# Patient Record
Sex: Female | Born: 1935 | Race: White | Marital: Single | Smoking: Never smoker
Health system: Northeastern US, Academic
[De-identification: ages and names within clinical notes are randomized; demographics above are authoritative.]

## PROBLEM LIST (undated history)

## (undated) DIAGNOSIS — E039 Hypothyroidism, unspecified: Secondary | ICD-10-CM

## (undated) DIAGNOSIS — Z9109 Other allergy status, other than to drugs and biological substances: Secondary | ICD-10-CM

## (undated) DIAGNOSIS — I10 Essential (primary) hypertension: Secondary | ICD-10-CM

## (undated) HISTORY — DX: Other allergy status, other than to drugs and biological substances: Z91.09

---

## 2015-09-06 LAB — LIPID PANEL
Chol/HDL Ratio: 2.1
Cholesterol: 109 mg/dL
HDL: 52 mg/dL
LDL Calculated: 45 mg/dL
Non HDL Cholesterol: 57 mg/dL
Triglycerides: 62 mg/dL

## 2016-07-11 LAB — HM MAMMOGRAPHY

## 2016-09-05 LAB — HEMOGLOBIN A1C: Hemoglobin A1C: 6.8 % — ABNORMAL HIGH

## 2016-12-21 DIAGNOSIS — I10 Essential (primary) hypertension: Secondary | ICD-10-CM

## 2016-12-21 DIAGNOSIS — E034 Atrophy of thyroid (acquired): Secondary | ICD-10-CM

## 2016-12-21 DIAGNOSIS — K219 Gastro-esophageal reflux disease without esophagitis: Secondary | ICD-10-CM

## 2016-12-21 DIAGNOSIS — E119 Type 2 diabetes mellitus without complications: Secondary | ICD-10-CM

## 2016-12-21 DIAGNOSIS — E782 Mixed hyperlipidemia: Secondary | ICD-10-CM

## 2016-12-21 DIAGNOSIS — M810 Age-related osteoporosis without current pathological fracture: Secondary | ICD-10-CM

## 2016-12-21 DIAGNOSIS — F331 Major depressive disorder, recurrent, moderate: Secondary | ICD-10-CM

## 2016-12-27 ENCOUNTER — Other Ambulatory Visit: Payer: Self-pay | Admitting: Primary Care

## 2016-12-27 ENCOUNTER — Encounter: Payer: Self-pay | Admitting: Primary Care

## 2016-12-27 MED ORDER — CALCIUM CARBONATE-VITAMIN D 600-400 MG-UNIT PO CHEW
1.0000 | CHEWABLE_TABLET | Freq: Every day | ORAL | 1 refills | Status: DC
Start: 2016-12-27 — End: 2016-12-28

## 2016-12-27 MED ORDER — LOSARTAN POTASSIUM 100 MG PO TABS *I*
100.0000 mg | ORAL_TABLET | Freq: Every day | ORAL | 1 refills | Status: DC
Start: 2016-12-27 — End: 2017-03-26

## 2016-12-27 MED ORDER — ASPIRIN 81 MG PO TBEC *I*
81.0000 mg | DELAYED_RELEASE_TABLET | Freq: Every day | ORAL | 1 refills | Status: DC
Start: 2016-12-27 — End: 2017-09-06

## 2016-12-27 MED ORDER — DULOXETINE HCL 20 MG PO CPEP *I*
20.0000 mg | DELAYED_RELEASE_CAPSULE | Freq: Every day | ORAL | 1 refills | Status: DC
Start: 2016-12-27 — End: 2017-04-02

## 2016-12-27 MED ORDER — METFORMIN HCL 500 MG PO TB24 *I*
500.0000 mg | ORAL_TABLET | Freq: Two times a day (BID) | ORAL | 1 refills | Status: DC
Start: 2016-12-27 — End: 2017-05-14

## 2016-12-27 MED ORDER — VITAMIN D 2000 UNIT PO TABS *I*
2000.0000 [IU] | ORAL_TABLET | Freq: Every day | ORAL | 1 refills | Status: DC
Start: 2016-12-27 — End: 2017-08-07

## 2016-12-27 MED ORDER — LEVOTHYROXINE SODIUM 100 MCG PO TABS *I*
100.0000 ug | ORAL_TABLET | Freq: Every day | ORAL | 1 refills | Status: DC
Start: 2016-12-27 — End: 2017-05-30

## 2016-12-27 MED ORDER — TAB-A-VITE PO TABS *A*
1.0000 | ORAL_TABLET | Freq: Every day | ORAL | 1 refills | Status: DC
Start: 2016-12-27 — End: 2017-08-07

## 2016-12-27 MED ORDER — PANTOPRAZOLE SODIUM 40 MG PO TBEC *I*
40.0000 mg | DELAYED_RELEASE_TABLET | Freq: Every day | ORAL | 1 refills | Status: DC
Start: 2016-12-27 — End: 2017-05-07

## 2016-12-27 MED ORDER — ATORVASTATIN CALCIUM 10 MG PO TABS *I*
10.0000 mg | ORAL_TABLET | Freq: Every day | ORAL | 1 refills | Status: DC
Start: 2016-12-27 — End: 2017-03-18

## 2016-12-28 ENCOUNTER — Telehealth: Payer: Self-pay | Admitting: Primary Care

## 2016-12-28 MED ORDER — CALCIUM CARBONATE-VITAMIN D 600-400 MG-UNIT PO TABS *WRAPPED*
1.0000 | ORAL_TABLET | Freq: Every day | ORAL | 1 refills | Status: DC
Start: 2016-12-28 — End: 2017-08-07

## 2016-12-28 NOTE — Telephone Encounter (Signed)
Can you please change script for this patient?

## 2016-12-28 NOTE — Telephone Encounter (Signed)
[facility] called about the script for calcium/vitamin D chew tabs, these are not covered by her insurance.  She will  Have to purchase these herself. Or Synetta Failnita can change the order to 600 mg or 400mg  regular tabs

## 2016-12-28 NOTE — Telephone Encounter (Signed)
Done

## 2017-01-10 ENCOUNTER — Encounter: Payer: Self-pay | Admitting: Gastroenterology

## 2017-01-15 ENCOUNTER — Encounter: Payer: Self-pay | Admitting: Primary Care

## 2017-02-26 ENCOUNTER — Encounter: Payer: Self-pay | Admitting: Gastroenterology

## 2017-03-16 ENCOUNTER — Other Ambulatory Visit: Payer: Self-pay | Admitting: Endocrinology

## 2017-03-16 LAB — COMPREHENSIVE METABOLIC PANEL
A/G RATIO: 1.4 (ref 1.1–1.8)
ALT: 13 U/L (ref 1–33)
AST: 19 U/L (ref 14–34)
Albumin: 3.9 g/dL (ref 3.5–5.2)
Alk Phos: 84 U/L (ref 53–141)
Bilirubin,Total: 0.6 mg/dL (ref 0.3–1.2)
CO2: 30 mmol/L (ref 20–31)
Calcium: 9.2 mg/dL (ref 8.6–10.2)
Chloride: 102 mmol/L (ref 99–109)
Creatinine: 0.71 mg/dL (ref 0.50–1.10)
Globulin: 2.8 GM/DL (ref 1.6–3.6)
Glucose: 102 MG/DL — ABNORMAL HIGH (ref 60–99)
Lab: 16 mg/dL (ref 9–23)
Potassium: 4.3 mmol/L (ref 3.5–5.1)
Sodium: 138 mmol/L (ref 136–145)
Total Protein: 6.7 g/dL (ref 6.4–8.3)
UN/Creat Ratio: 23 — ABNORMAL HIGH (ref 10–20)

## 2017-03-16 LAB — LIPID PANEL
Chol/HDL Ratio: 2.1
Cholesterol: 115 mg/dL (ref 1–199)
HDL: 56 mg/dL (ref 40–60)
LDL Calculated: 47 mg/dL (ref <100)
Non HDL Cholesterol: 59 mg/dL (ref <130)
Triglycerides: 60 mg/dL (ref <150)

## 2017-03-16 LAB — CBC AND DIFFERENTIAL
Baso # K/uL: 0.1 10*3/uL (ref 0.0–0.2)
Basophil %: 0.7 % (ref 0.0–1.8)
Eos # K/uL: 0.4 10*3/uL (ref 0.0–0.5)
Eosinophil %: 6.3 % (ref 0.0–7.0)
Hematocrit: 32.6 % — ABNORMAL LOW (ref 34.0–47.0)
Hemoglobin: 10.6 g/dL — ABNORMAL LOW (ref 11.5–16.0)
Lymph # K/uL: 2 10*3/uL (ref 0.9–3.8)
Lymphocyte %: 29 % (ref 17.0–44.0)
MCH: 29.1 PG (ref 26.0–34.0)
MCHC: 32.5 g/dL (ref 32.0–36.0)
MCV: 89.6 FL (ref 81.0–99.0)
Mono # K/uL: 0.4 10*3/uL (ref 0.2–1.0)
Monocyte %: 6.3 % (ref 4.0–12.0)
Neut # K/uL: 4 10*3/uL (ref 1.5–7.7)
Platelets: 313 10*3/uL (ref 140–400)
RBC: 3.64 10*6/uL — ABNORMAL LOW (ref 3.80–5.20)
RDW: 12.8 % (ref 11.5–15.0)
Seg Neut %: 57.7 % (ref 40.0–75.0)
WBC: 6.9 10*3/uL (ref 4.0–10.8)

## 2017-03-16 LAB — T4, FREE: Free T4: 1.2 ng/dL (ref 0.9–1.8)

## 2017-03-16 LAB — HEMOGLOBIN A1C
Est Avg Glucose: 134 mg/dL — ABNORMAL HIGH (ref 68–126)
Hemoglobin A1C: 6.3 % — ABNORMAL HIGH (ref 4.0–6.0)

## 2017-03-16 LAB — TSH: TSH: 0.66 u[IU]/mL (ref 0.55–4.78)

## 2017-03-16 LAB — ESTIMATED GFR
GFR,Black: >60 mL/min/{1.73_m2} (ref >=60)
GFR,Caucasian: >60 mL/min/{1.73_m2} (ref >=60)

## 2017-03-18 ENCOUNTER — Ambulatory Visit: Payer: Medicare Other | Attending: Primary Care | Admitting: Primary Care

## 2017-03-18 ENCOUNTER — Encounter: Payer: Self-pay | Admitting: Gastroenterology

## 2017-03-18 VITALS — BP 124/82 | HR 76 | Temp 98.8°F | Ht 63.0 in | Wt 137.7 lb

## 2017-03-18 DIAGNOSIS — E785 Hyperlipidemia, unspecified: Secondary | ICD-10-CM

## 2017-03-18 DIAGNOSIS — B354 Tinea corporis: Secondary | ICD-10-CM

## 2017-03-18 DIAGNOSIS — F32A Depression, unspecified: Secondary | ICD-10-CM

## 2017-03-18 DIAGNOSIS — B356 Tinea cruris: Secondary | ICD-10-CM

## 2017-03-18 DIAGNOSIS — F329 Major depressive disorder, single episode, unspecified: Secondary | ICD-10-CM

## 2017-03-18 DIAGNOSIS — K219 Gastro-esophageal reflux disease without esophagitis: Secondary | ICD-10-CM

## 2017-03-18 DIAGNOSIS — E119 Type 2 diabetes mellitus without complications: Secondary | ICD-10-CM

## 2017-03-18 DIAGNOSIS — Z1231 Encounter for screening mammogram for malignant neoplasm of breast: Secondary | ICD-10-CM

## 2017-03-18 DIAGNOSIS — I10 Essential (primary) hypertension: Secondary | ICD-10-CM

## 2017-03-18 DIAGNOSIS — E039 Hypothyroidism, unspecified: Secondary | ICD-10-CM

## 2017-03-18 DIAGNOSIS — Z1239 Encounter for other screening for malignant neoplasm of breast: Secondary | ICD-10-CM

## 2017-03-18 MED ORDER — ZINC OXIDE 11.3 % EX CREA *I*
TOPICAL_CREAM | Freq: Three times a day (TID) | CUTANEOUS | 3 refills | Status: DC | PRN
Start: 2017-03-18 — End: 2017-05-02

## 2017-03-18 NOTE — Progress Notes (Signed)
Kristina Shields is a 81 y.o. female (Oct 30, 1935)    Nurses note:   Follow-up; Other (would like reqs for mammogram and dexa scan for end of January); and Rash (Breast and abdominal fold)    Vitals:    03/18/17 1038   BP: 124/82   Pulse: 76   Temp: 37.1 C (98.8 F)   Weight: 62.5 kg (137 lb 11.2 oz)   Height: 1.6 m ( )         CC: Follow-up of hypertension, GERD, dyslipidemia, diabetes 2, depression, anxiety, vitamin D deficiency and concerned regarding some rash of the skin    HPI: Kristina Shields is a 81 year old Caucasian lady who was brought in today by her caregivers.  They voiced no concerns regarding Prabhleen they think that she is doing quite well.  Andrew denies any pain or discomfort.  Edward Jolly denies any nausea vomiting constipation or diarrhea.  Does admit to have frequent burping but no heartburn type of symptoms.    Says, she tries to eat right.  Her caregiver keeps her on low-carb and low-fat diet.  She is quite compliant with her medications and diet.  For her dyslipidemia currently she is taking Lipitor 10 mg daily, for hypothyroidism she is on levothyroxine 100 g daily.  For blood pressure she is on losartan 100 mg daily.  4 diabetes she is on metformin 500 mg twice a day and for GERD she is on Protonix 40 mg daily her past history is also significant for hiatal hernia.  She also takes vitamin D and calcium supplement.  For depression she is on Cymbalta 20 mg daily.    Her last mammogram was in January 2018.  Her caregiver would like to have a neutral position for her mammogram for January.    Her caregiver's history last week her skin underneath her both breasts groins and lower abdominal fold he came quite red and scaly.  She is trying zinc oxide cream.  Times a day with positive results.  She also tried very hard to keep these areas dry as much as possible.  And that strategy worked for her and now most of her redness have resolved.    ROS  Review of systems:    HEENT: Denies any problem. Negative for  hearing loss.   Endocrinologic: Denies any problem.  Positive history of diabetes and hypothyroidism   Respiratory: Denies any shortness of breath, cough or wheezing  Cardiovascular: Negative for chest pain or peripheral edema.  Gastroenterologic: Negative for nausea, vomiting, constipation or diarrhea.  Denies any abdominal pain.  Urinary: Denies any problem.  Musculoskeletal:Denies any problem .    Neurological: Negative for seizure or migraine headache.   Psychiatric/behavioral:  Negative for any depression or anxiety   Dermatological: Denies any problem.  Denies any rash or pruritus.    No Known Allergies (drug, envir, food or latex)    Current Outpatient Prescriptions   Medication    guaiFENesin (ROBITUSSIN) 100 MG/5ML syrup    neomycin-bacitracin-polymyxin (NEOMYCIN-BACITRACIN-POLYMYXIN) ointment    calcium carbonate-vitamin D 600-400 MG-UNIT per tablet    acetaminophen (TYLENOL) 325 MG tablet    metFORMIN (GLUCOPHAGE-XR) 500 MG 24 hr tablet    Multiple Vitamin (TAB-A-VITE) tablet    losartan (COZAAR) 100 MG tablet    aspirin 81 MG EC tablet    pantoprazole (PROTONIX) 40 MG EC tablet    levothyroxine (SYNTHROID, LEVOTHROID) 100 MCG tablet    cholecalciferol (VITAMIN D) 2000 units tablet    DULoxetine (CYMBALTA) 20 MG DR  capsule    zinc oxide (BALMEX) 11.3 % cream    albuterol HFA (VENTOLIN HFA) 108 (90 Base) MCG/ACT inhaler     No current facility-administered medications for this visit.        No past medical history on file.  Social History     Social History    Marital status: Single     Spouse name: N/A    Number of children: N/A    Years of education: N/A     Occupational History    Not on file.     Social History Main Topics    Smoking status: Not on file    Smokeless tobacco: Not on file    Alcohol use Not on file    Drug use: Not on file    Sexual activity: Not on file     Social History Narrative         Physical Exam  No acute distress.  Eyes: Clear conjunctiva.  No mucous  discharge.  PERRLA  Ears: Normal pinnae.  Acuity  to conversational tones is good.    Both canals are clear.  Tympanic membranes appear gray and pearly.  Nose: Pink and moist mucosa.  No sinus tenderness.  Throat: Pink and moist mucosa, no exudate or postnasal drip.  Neck:  Trachea midline.  No thyromegaly.  No visible or palpable masses noted.  No cervical or supraclavicular lymphadenopathy  Chest: Clear to auscultate bilaterally, with good air movement and no wheezes or rales.  No accessory muscle usage.  Respirations unlabored.   Heart: S1 and S2 regular rate and rhythm.  No murmur or gallop  Abdomen: Soft, nontender and normoactive bowel sounds.  No overt hepatosplenomegaly  Lower extremities: No clubbing, cyanosis or edema.  No ulcerations or any increased warmth noted.  Skin: Exam of her bilateral inframammary area, lower abdominal folds and groin showed normal clear skin with a few postinflammatory hyperpigmented patches.  No active lesion or erythematous patches noted today.           1. Tinea cruris  Caregivers were advised to use zinc oxide cream 3 times per day as needed basis for her lower abdominal fold and groins.  Caregivers were advised to use this cream for 7 days for each flareup and if her rash does not improve in 7 days then they should bring her back here for evaluation.  - zinc oxide (BALMEX) 11.3 % cream; Apply topically 3 times daily as needed for Diaper Rash   For 7 days.  Dispense: 113 g; Refill: 3    2. Tinea corporis  Currently she does not have any active rash.  But caregivers were advised to use single oxide cream 3 times per day as needed basis for any flareups of the tinea corporis.  In case after using for 7 days things have not improved and she should bring her back here for further evaluation  - zinc oxide (BALMEX) 11.3 % cream; Apply topically 3 times daily as needed for Diaper Rash   For 7 days.  Dispense: 113 g; Refill: 3    3. Gastroesophageal reflux disease without  esophagitis  Continue Protonix 40 mg daily    4. Depression  Continue duloxetine 20 mg daily    5.  Diabetes mellitus type 2  Continue metformin 500 mg twice a day.  Her last hemoglobin A1c was 6.3 on March 16, 2017.  Average glucose 134.      6. Acquired hypothyroidism  Continue levothyroxine 100 g  daily  Her last TSH was 0.66 and T4 was 1.2 on March 16, 2017    7. Essential hypertension  Continue losartan 100 mg daily.  Her CMP was within normal limits    8. Breast cancer screening  Requisition for mammogram was provided to her to do it in January 2019  - Mammography screening BILATERAL; Future  9.  Dyslipidemia  Her cholesterol levels are quite low and therefore we will discontinue Lipitor 10 mg daily and see how she does without any medication as she eats quite well.  Advised her to stay on low-fat diet as much as possible.  Her last cholesterol levels were very satisfactory.  Total cholesterol 1:15, HDL 56, triglycerides 60, LDL 47, non-HDL 59 and ratio was 2.1.    Her last mammogram was done last year January a requisition form was provided to her to get another mammogram in this January 2019  Return in about 3 months (around 06/18/2017).

## 2017-03-26 ENCOUNTER — Other Ambulatory Visit: Payer: Self-pay | Admitting: Primary Care

## 2017-04-02 ENCOUNTER — Other Ambulatory Visit: Payer: Self-pay | Admitting: Primary Care

## 2017-04-18 ENCOUNTER — Encounter: Payer: Self-pay | Admitting: Gastroenterology

## 2017-04-18 LAB — HM DIABETES FOOT EXAM

## 2017-05-01 ENCOUNTER — Encounter: Payer: Medicare Other | Admitting: Radiology

## 2017-05-01 DIAGNOSIS — R05 Cough: Secondary | ICD-10-CM

## 2017-05-02 ENCOUNTER — Telehealth: Payer: Self-pay | Admitting: Primary Care

## 2017-05-02 ENCOUNTER — Ambulatory Visit: Payer: Medicare Other | Admitting: Primary Care

## 2017-05-02 ENCOUNTER — Other Ambulatory Visit: Payer: Self-pay | Admitting: Family Medicine

## 2017-05-02 MED ORDER — ZINC OXIDE 20 % EX OINT *I*
TOPICAL_OINTMENT | CUTANEOUS | 2 refills | Status: AC | PRN
Start: 2017-05-02 — End: ?

## 2017-05-02 NOTE — Telephone Encounter (Signed)
Kristina Shields called about the zinc oxide script.  The 11.3 % is not covered, but 20% is.  We need to send a new script to [facility] [place]

## 2017-05-02 NOTE — Telephone Encounter (Signed)
ZINC OXIDE 11.3 % IS NOT COVERED UNDER HER INSURANCE BUT IT WILL COVER ZINC OXIDE 20% THEY ARE ASKING IF YOU WILL CHANGE THE PRESCRIPTION

## 2017-05-02 NOTE — Telephone Encounter (Signed)
It is done.

## 2017-05-07 ENCOUNTER — Other Ambulatory Visit: Payer: Self-pay | Admitting: Primary Care

## 2017-05-08 ENCOUNTER — Ambulatory Visit: Payer: Medicare Other | Admitting: Primary Care

## 2017-05-14 ENCOUNTER — Encounter: Payer: Self-pay | Admitting: Gastroenterology

## 2017-05-14 ENCOUNTER — Ambulatory Visit: Payer: Medicare Other | Attending: Primary Care | Admitting: Primary Care

## 2017-05-14 VITALS — BP 112/68 | HR 80 | Temp 98.8°F | Ht 63.0 in | Wt 144.0 lb

## 2017-05-14 DIAGNOSIS — H1045 Other chronic allergic conjunctivitis: Secondary | ICD-10-CM

## 2017-05-14 DIAGNOSIS — Z1239 Encounter for other screening for malignant neoplasm of breast: Secondary | ICD-10-CM

## 2017-05-14 DIAGNOSIS — F329 Major depressive disorder, single episode, unspecified: Secondary | ICD-10-CM

## 2017-05-14 DIAGNOSIS — D649 Anemia, unspecified: Secondary | ICD-10-CM

## 2017-05-14 DIAGNOSIS — Z1231 Encounter for screening mammogram for malignant neoplasm of breast: Secondary | ICD-10-CM

## 2017-05-14 DIAGNOSIS — F32A Depression, unspecified: Secondary | ICD-10-CM

## 2017-05-14 DIAGNOSIS — F419 Anxiety disorder, unspecified: Secondary | ICD-10-CM

## 2017-05-14 DIAGNOSIS — K219 Gastro-esophageal reflux disease without esophagitis: Secondary | ICD-10-CM

## 2017-05-14 DIAGNOSIS — I10 Essential (primary) hypertension: Secondary | ICD-10-CM

## 2017-05-14 DIAGNOSIS — M810 Age-related osteoporosis without current pathological fracture: Secondary | ICD-10-CM

## 2017-05-14 DIAGNOSIS — E119 Type 2 diabetes mellitus without complications: Secondary | ICD-10-CM

## 2017-05-14 DIAGNOSIS — J309 Allergic rhinitis, unspecified: Secondary | ICD-10-CM

## 2017-05-14 DIAGNOSIS — E039 Hypothyroidism, unspecified: Secondary | ICD-10-CM

## 2017-05-14 DIAGNOSIS — E559 Vitamin D deficiency, unspecified: Secondary | ICD-10-CM

## 2017-05-14 MED ORDER — LORATADINE 10 MG PO TABS *I*
10.0000 mg | ORAL_TABLET | Freq: Every evening | ORAL | 1 refills | Status: DC
Start: 2017-05-14 — End: 2017-07-08

## 2017-05-14 NOTE — Progress Notes (Signed)
Kristina Shields is a 81 y.o. female (1935/12/21)    Nurses note:   Follow-up (Urgent care, URI)    Vitals:    05/14/17 0822   BP: 112/68   Pulse: 80   Temp: 37.1 C (98.8 F)   Weight: 65.3 kg (144 lb)   Height: 1.6 m (5\' 3" )         CC: Follow-up of hypertension, hypothyroidism, GERD, depression, diabetes type 2 and vitamin D deficiency    HPI: Kristina Shields is a 81 year old Caucasian lady who presented today for follow-up of her chronic conditions.  Her past history is significant for hypertension, hypothyroidism, GERD, depression, diabetes type 2 and vitamin D deficiency.  Per her caregiver's history she was taken to an urgent care about 10 days ago for increased cough and some upper respiratory tract infection type of symptoms , where she was diagnosed with viral illness and they advised her to treat symptomatically.  Since then her symptoms have improved significantly.  Now her cough and throat irritation has pretty much resolved.    Kristina Shields voices no new concerns for today.  She feels she is doing quite well.  She denies any headache, dizziness or lightheadedness.  Per caregiver's history Kristina Shields is doing very well and appears to be quite stable  Kristina Shields denies any increased fatigue, constipation or dryness of skin  ROS  Denies any chest pain or shortness of breath.  Denies any nausea or vomiting, constipation or diarrhea    No Known Allergies (drug, envir, food or latex)    Current Outpatient Prescriptions   Medication    glimepiride (AMARYL) 1 MG tablet    DULoxetine (CYMBALTA) 30 MG DR capsule    pantoprazole (PROTONIX) 40 MG EC tablet    zinc oxide 20 % ointment    losartan (COZAAR) 100 MG tablet    guaiFENesin (ROBITUSSIN) 100 MG/5ML syrup    neomycin-bacitracin-polymyxin (NEOMYCIN-BACITRACIN-POLYMYXIN) ointment    calcium carbonate-vitamin D 600-400 MG-UNIT per tablet    acetaminophen (TYLENOL) 325 MG tablet    Multiple Vitamin (TAB-A-VITE) tablet    aspirin 81 MG EC tablet    levothyroxine (SYNTHROID,  LEVOTHROID) 100 MCG tablet    cholecalciferol (VITAMIN D) 2000 units tablet    loratadine (CLARITIN) 10 MG tablet     No current facility-administered medications for this visit.        No past medical history on file.  Social History     Social History    Marital status: Single     Spouse name: N/A    Number of children: N/A    Years of education: N/A     Occupational History    Not on file.     Social History Main Topics    Smoking status: Not on file    Smokeless tobacco: Not on file    Alcohol use Not on file    Drug use: Unknown    Sexual activity: Not on file     Social History Narrative    No narrative on file         Physical Exam  No acute distress.  Eyes: Clear conjunctiva.  No mucous discharge. Mild dried clear discharge noted around the right eye.  PERRLA  Ears: Normal pinnae.  Acuity  to conversational tones is good.    Both canals are clear.  Tympanic membranes appear gray and pearly.  Nose: Pink and moist mucosa.  No sinus tenderness.  Throat: Pink and moist mucosa, no exudate or postnasal drip.  Neck:  Trachea midline.  No thyromegaly.  No visible or palpable masses noted.  No cervical or supraclavicular lymphadenopathy  Chest: Clear to auscultate bilaterally, with good air movement and no wheezes or rales.  No accessory muscle usage.  Respirations unlabored.   Heart: S1 and S2 regular rate and rhythm.  No murmur or gallop  Abdomen: Soft, nontender and normoactive bowel sounds.  No overt hepatosplenomegaly  Lower extremities: No clubbing, cyanosis or edema.  No ulcerations or any increased warmth noted.    Neuro: No motor or sensory loss noted in her lower extremities.  Touch sensation is intact in both feet.              1. Essential hypertension  Advised her to continue with losartan 100 mg daily.  - Lipid add Rfx to Drt LDL if Trig >400; Future  - Comprehensive metabolic panel; Future  - CBC; Future    2. DM2 (diabetes mellitus, type 2)  Continue Amaryl 0.5 mg daily  - Lipid add Rfx to  Drt LDL if Trig >400; Future  - Comprehensive metabolic panel; Future  - Hemoglobin A1c; Future    3. Acquired hypothyroidism  Continue levothyroxine 100 g daily.  Advised her to stay on low carb diet as much as possible.  We will check a hemoglobin A1c and TSH  - TSH; Future  - T4, free; Future  - Comprehensive metabolic panel; Future    4. Gastroesophageal reflux disease without esophagitis  Continue Protonix 40 mg daily.  Advised her to stay on non-spicy and low-fat diet as much as possible.    5. Depression  Continue Cymbalta 30 mg daily  - CBC; Future    6. Anxiety  Continue Cymbalta 30 mg daily  - CBC; Future    7. Rhinitis, allergic  Continue Claritin 10 mg daily  - loratadine (CLARITIN) 10 MG tablet; Take 1 tablet (10 mg total) by mouth at bedtime  Dispense: 30 tablet; Refill: 1    8. Other chronic allergic conjunctivitis of both eyes  Continue Claritin 10 mg daily  - loratadine (CLARITIN) 10 MG tablet; Take 1 tablet (10 mg total) by mouth at bedtime  Dispense: 30 tablet; Refill: 1    9. Anemia, unspecified type  She does have a history of chronic anemia will check her CBC.  - CBC; Future    10. Vitamin D deficiency  Continue vitamin D 2000 IU daily  - Vitamin D; Future    11. Screening for breast cancer  She is due for her next mammogram in January 2019, a requisition was provided to her  - Mammography screening BILATERAL; Future    12. Age-related osteoporosis without current pathological fracture  She is due for her next DEXA scan in January 2019 a requisition was provided to her.  - DEXA Scan; Future        Return in about 4 months (around 09/11/2017).

## 2017-05-30 ENCOUNTER — Other Ambulatory Visit: Payer: Self-pay | Admitting: Primary Care

## 2017-05-31 ENCOUNTER — Encounter: Payer: Self-pay | Admitting: Gastroenterology

## 2017-07-03 ENCOUNTER — Telehealth: Payer: Self-pay | Admitting: Primary Care

## 2017-07-03 DIAGNOSIS — R8290 Unspecified abnormal findings in urine: Secondary | ICD-10-CM

## 2017-07-03 NOTE — Telephone Encounter (Signed)
Dawn called and was asking us to put an order in for a urine. They feel the patient has a UTI, she has very cloudy and strong urine. Can you do a lab slip and I will fax it to [facility]

## 2017-07-03 NOTE — Telephone Encounter (Signed)
It is done,  please fax this order to the lab.

## 2017-07-04 NOTE — Telephone Encounter (Signed)
Called and left message for Dawn to call us back, [facility]

## 2017-07-04 NOTE — Telephone Encounter (Signed)
Spoke with Dawn

## 2017-07-08 ENCOUNTER — Other Ambulatory Visit: Payer: Self-pay | Admitting: Primary Care

## 2017-07-08 DIAGNOSIS — J309 Allergic rhinitis, unspecified: Secondary | ICD-10-CM

## 2017-07-08 DIAGNOSIS — H1045 Other chronic allergic conjunctivitis: Secondary | ICD-10-CM

## 2017-07-09 NOTE — Telephone Encounter (Signed)
Kristina ElmSylvia was last seen: 05/14/17

## 2017-07-10 ENCOUNTER — Other Ambulatory Visit: Payer: Self-pay | Admitting: Primary Care

## 2017-07-10 LAB — URINALYSIS WITH REFLEX TO MICROSCOPIC
Bilirubin,Ur: NEGATIVE
Blood,UA: NEGATIVE
Glucose,UA: NEGATIVE g/dl
Hyaline Casts,UA: 1 /lpf (ref <2)
Ketones, UA: NEGATIVE mg/dl
Nitrite,UA: NEGATIVE
Protein,UA: NEGATIVE mg/dL
RBC,UA: 1 /hpf (ref <3)
Specific Gravity,UA: 1.017 (ref 1.010–1.030)
Squam Epithel,UA: 3 /hpf
WBC,UA: 16 /hpf — AB (ref <6)
pH,UR: 6 (ref 5.5–7.0)

## 2017-07-11 LAB — AEROBIC CULTURE

## 2017-07-12 ENCOUNTER — Other Ambulatory Visit: Payer: Self-pay | Admitting: Primary Care

## 2017-07-15 ENCOUNTER — Telehealth: Payer: Self-pay | Admitting: Primary Care

## 2017-07-15 NOTE — Telephone Encounter (Signed)
Synetta Failnita the number that is in her chart she no longer lives there as of July

## 2017-07-15 NOTE — Telephone Encounter (Signed)
-----   Message from Frances MaywoodAnita Prasad, NP sent at 07/11/2017  1:39 PM EST -----  Please tell Kristina Shields's caregiver that her urine culture was negative for any infection and she does not have any UTI

## 2017-07-15 NOTE — Telephone Encounter (Signed)
Last in 05/14/17 but has a appt 07/30/17

## 2017-07-16 NOTE — Telephone Encounter (Signed)
The front desk might know her new number as she is living in this new home for lasts more than 6 months and that caregiver calls us quite frequently.

## 2017-07-17 ENCOUNTER — Encounter: Payer: Self-pay | Admitting: Gastroenterology

## 2017-07-18 NOTE — Telephone Encounter (Signed)
Unfortunately we do not have any record for the number. There is no archive or media records that we can look up

## 2017-07-19 NOTE — Telephone Encounter (Signed)
Can we mail a letter for patient to contact us to the current address we have on file for her and perhaps the post office will transfer the mail to where she is.  Worth a try.

## 2017-07-19 NOTE — Telephone Encounter (Signed)
Mailed letter to address in chart.

## 2017-07-25 ENCOUNTER — Other Ambulatory Visit: Payer: Self-pay | Admitting: Gastroenterology

## 2017-07-27 ENCOUNTER — Other Ambulatory Visit: Payer: Self-pay | Admitting: Primary Care

## 2017-07-27 LAB — UNMAPPED LAB RESULTS
Hematocrit (HT): 36.3 % — NL (ref 34.0–47.0)
Hematocrit (HT): 36.3 % — NL (ref 34.0–47.0)
Hemoglobin (HGB) (HT): 12 g/dL — NL (ref 11.5–16.0)
Hemoglobin (HGB) (HT): 12 g/dL — NL (ref 11.5–16.0)
MCHC (HT): 33.1 g/dL — NL (ref 32.0–36.0)
MCHC (HT): 33.1 g/dL — NL (ref 32.0–36.0)
MCV (HT): 88.3 FL — NL (ref 81.0–99.0)
MCV (HT): 88.3 FL — NL (ref 81.0–99.0)
Mean Corpuscular Hemoglobin (MCH) (HT): 29.2 pg — NL (ref 26.0–34.0)
Mean Corpuscular Hemoglobin (MCH) (HT): 29.2 pg — NL (ref 26.0–34.0)
Platelets (HT): 324 10 3/uL — NL (ref 140–400)
Platelets (HT): 324 10 3/uL — NL (ref 140–400)
RBC (HT): 4.11 10 6/uL — NL (ref 3.80–5.20)
RBC (HT): 4.11 10 6/uL — NL (ref 3.80–5.20)
RDW (HT): 13.2 % — NL (ref 11.5–15.0)
RDW (HT): 13.2 % — NL (ref 11.5–15.0)
WBC (HT): 11.7 10 3/uL — ABNORMAL HIGH (ref 4.0–10.8)
WBC (HT): 11.7 10 3/uL — ABNORMAL HIGH (ref 4.0–10.8)

## 2017-07-27 LAB — CBC
Hematocrit: 36.3 % (ref 34.0–47.0)
Hemoglobin: 12 g/dL (ref 11.5–16.0)
MCH: 29.2 PG (ref 26.0–34.0)
MCHC: 33.1 g/dL (ref 32.0–36.0)
MCV: 88.3 FL (ref 81.0–99.0)
Platelets: 324 10*3/uL (ref 140–400)
RBC: 4.11 10*6/uL (ref 3.80–5.20)
RDW: 13.2 % (ref 11.5–15.0)
WBC: 11.7 10*3/uL — ABNORMAL HIGH (ref 4.0–10.8)

## 2017-07-27 LAB — COMPREHENSIVE METABOLIC PANEL
A/G RATIO: 1.4 (ref 1.1–1.8)
ALT: 17 U/L (ref 1–33)
AST: 23 U/L (ref 14–34)
Albumin: 4.1 g/dL (ref 3.5–5.2)
Alk Phos: 98 U/L (ref 53–141)
Bilirubin,Total: 0.7 mg/dL (ref 0.3–1.2)
CO2: 29 mmol/L (ref 20–31)
Calcium: 9.1 mg/dL (ref 8.6–10.2)
Chloride: 102 mmol/L (ref 99–109)
Creatinine: 0.85 mg/dL (ref 0.50–1.10)
Globulin: 3 GM/DL (ref 1.6–3.6)
Glucose: 95 MG/DL (ref 60–99)
Lab: 21 mg/dL (ref 9–23)
Potassium: 4.4 mmol/L (ref 3.5–5.1)
Sodium: 138 mmol/L (ref 136–145)
Total Protein: 7.1 g/dL (ref 6.4–8.3)
UN/Creat Ratio: 25 — ABNORMAL HIGH (ref 10–20)

## 2017-07-27 LAB — HEMOGLOBIN A1C
Est Avg Glucose: 120 mg/dL (ref 68–126)
Hemoglobin A1C: 5.8 % — ABNORMAL HIGH (ref 4.2–5.6)

## 2017-07-27 LAB — LIPID PANEL
Chol/HDL Ratio: 2.8
Cholesterol: 183 mg/dL (ref 1–199)
HDL: 66 mg/dL — ABNORMAL HIGH (ref 40–60)
LDL Calculated: 103 mg/dL (ref <100)
Non HDL Cholesterol: 117 mg/dL (ref <130)
Triglycerides: 69 mg/dL (ref <150)

## 2017-07-27 LAB — VITAMIN D: 25-OH Vit Total: 45 ng/mL (ref >=20)

## 2017-07-27 LAB — T4, FREE: Free T4: 1.2 ng/dL (ref 0.9–1.8)

## 2017-07-27 LAB — TSH: TSH: 2.2 u[IU]/mL (ref 0.55–4.78)

## 2017-07-27 LAB — ESTIMATED GFR
GFR,Black: >60 mL/min/{1.73_m2} (ref >=60)
GFR,Caucasian: >60 mL/min/{1.73_m2} (ref >=60)

## 2017-07-30 ENCOUNTER — Encounter: Payer: Self-pay | Admitting: Gastroenterology

## 2017-07-30 ENCOUNTER — Encounter: Payer: Self-pay | Admitting: Primary Care

## 2017-07-30 ENCOUNTER — Ambulatory Visit: Payer: Medicare Other | Attending: Primary Care | Admitting: Primary Care

## 2017-07-30 VITALS — BP 140/70 | HR 80 | Temp 97.6°F | Wt 145.4 lb

## 2017-07-30 DIAGNOSIS — Z0000 Encounter for general adult medical examination without abnormal findings: Secondary | ICD-10-CM

## 2017-07-30 MED ORDER — ZOSTER VAC RECOMB ADJUVANTED 50 MCG IM SUSR *I*
50.0000 ug | Freq: Once | INTRAMUSCULAR | 1 refills | Status: AC
Start: 2017-07-30 — End: 2017-07-30

## 2017-07-30 NOTE — Patient Instructions (Addendum)
Thank you for completing your Subsequent Annual Medicare Visit (they would like her to have a breast exam )   with us today.     The purpose of this visits was to:     Screen for disease   Assess risk of future medical problems   Help develop a healthy lifestyle   Update vaccines   Get to know your doctor in case of an illness    Patient Care Team:  Viona GilmorePersaud, Krishna V, MD as PCP - General (Primary Care)  Frances MaywoodPrasad, Kamille Toomey, NP as PCP - [facility]   (Primary Care)       The following items were identified as areas of concern during your screening today:  BMI greater than 25 - This is a risk for Heart Attack, Stroke, High Blood Pressure, Diabetes, High Cholesterol and other complications.       The Health Maintenance table below identifies screening tests and immunizations recommended by your health care team:  Health Maintenance   Topic Date Due    OPHTHALMOLOGY EXAM  06/04/1946    HIV TESTING OFFERED  06/05/1949    IMM-PREVNAR VACCINE 65 + YRS (1) 06/05/2003    IMM-PNEUMOVAX VACCINE 65 + YRS (1) 06/05/2003    Fall Risk Screening  06/05/2003    IMM-ZOSTER (2 of 3) 08/24/2010    LIPID DISORDER SCREENING  09/05/2016    HEMOGLOBIN A1C  03/08/2017    URINE MICROALBUMIN  10/20/2017 (Originally 06/04/1946)    FOOT EXAM  04/18/2018    IMM-INFLUENZA  Completed     In addition, goals and orders placed to address these recommendations are listed above.    We wish you the best of health and look forward to seeing you again next year for your Annual Medicare Wellness Visit.     If you have any health care concerns before then, please do not hesitate to contact us.

## 2017-07-30 NOTE — Progress Notes (Signed)
Today we reviewed and updated Kristina Shields smoking status, activities of daily living, depression screen, fall risk, medications and allergies.   I have counseled the patient in the above areas.     Subjective:     Chief Complaint: Kristina Shields is a 82 y.o. female here for a/an Subsequent Annual Medicare Visit (they would like her to have a breast exam )    In general, Kristina Shields rates their overall health as:  good    Patient Care Team:  Viona GilmorePersaud, Krishna V, MD as PCP - General (Primary Care)  Frances MaywoodPrasad, Marce Schartz, NP as PCP - [facility]   (Primary Care)     There are no active problems to display for this patient.    Her past history is significant for hypothyroidism, hypertension, GERD, depression,Diabetes type 2 , allergic rhinitis, anemia and vitamin D deficiency  No past surgical history on file.  No family history on file.  Social History     Social History    Marital status: Single     Spouse name: N/A    Number of children: N/A    Years of education: N/A     Occupational History    Not on file.     Social History Main Topics    Smoking status: Never Smoker    Smokeless tobacco: Never Used    Alcohol use Not on file    Drug use: Unknown    Sexual activity: Not on file     Social History Narrative    No narrative on file       Objective:     Vital Signs: BP 140/70    Pulse 80    Temp 36.4 C (97.6 F) (Temporal)    Wt 66 kg (145 lb 6.4 oz)    BMI 25.76 kg/m    BMI: Body mass index is 25.76 kg/m.    Vision Screening Results (Welcome visit only):      No acute distress.  Eyes: Clear conjunctiva.  No mucous discharge.  PERRLA  Ears: Normal pinnae.  Acuity  to conversational tones is good.    Both canals are clear.  Tympanic membranes appear gray and pearly.  Nose: Pink and moist mucosa.  No sinus tenderness.  Throat: Pink and moist mucosa, no exudate or postnasal drip.  Teeth in good condition a few missing molars and multiple fillings.  Neck:  Trachea midline.  No thyromegaly.   No visible or palpable masses noted.  No cervical or supraclavicular lymphadenopathy  Chest: Clear to auscultate bilaterally, with good air movement and no wheezes or rales.  No accessory muscle usage.  Respirations unlabored.  Breasts: Bilateral symmetrical breasts.  No palpable nodules noted.  No discharge from her nipples.  No regional lymphadenopathy noted. (Mammogram was done recently which showed a suspicious lesion on her right breast and she is scheduled to go for ultrasound of her breast)   Heart: S1 and S2 regular rate and rhythm.  No murmur or gallop  Abdomen: Soft, nontender and normoactive bowel sounds.  No overt hepatosplenomegaly  Lower extremities: No clubbing, cyanosis or edema.  No ulcerations or any increased warmth noted.  Musculoskeletal: Full range of motion without any increase in pain .No joint effusion or erythema noted.  Strength 5 out of 5 in all 4 extremities.   Neuro: Alert and oriented x3.  DTR 2+.  Cranial nerves II through XII are grossly intact.  No scoliosis noted  Skin: In general her skin appeared pink, warm  and intact.  She does have extensive hypopigmented patches on her skin related to vitiligo.  No suspicious lesions or ulcerations noted.  Psych: Normal mood and affect.  Normal thought process.  Maintaining reasonable eye contact during the conversation   External genitalia: Normal-appearing external genitalia.  No vaginal discharge noted. 2 small noninflamed skin tags noted around her rectum.               Assessment and Plan:      Cognitive Function:  Recall of recent and remote events appears:  Abnormal - fair memory    The following health maintenance plan was reviewed with the patient:    Health Maintenance   Topic Date Due    OPHTHALMOLOGY EXAM  06/04/1946    HIV TESTING OFFERED  06/05/1949    IMM-PREVNAR VACCINE 65 + YRS (1) 06/05/2003    IMM-PNEUMOVAX VACCINE 65 + YRS (1) 06/05/2003    Fall Risk Screening  06/05/2003    IMM-ZOSTER (2 of 3) 08/24/2010    LIPID  DISORDER SCREENING  09/05/2016    HEMOGLOBIN A1C  03/08/2017    URINE MICROALBUMIN  10/20/2017 (Originally 06/04/1946)    FOOT EXAM  04/18/2018    IMM-INFLUENZA  Completed     This health maintenance schedule, identified risks, a list of orders placed today and patient goals have been provided to Kristina Shields in the after visit summary.      Shingles (shingrix) shot was ordered today. Prescription was sent to the pharmacy.

## 2017-07-31 ENCOUNTER — Telehealth: Payer: Self-pay

## 2017-07-31 MED ORDER — ZOSTER VAC RECOMB ADJUVANTED 50 MCG IM SUSR *I*
50.0000 ug | Freq: Once | INTRAMUSCULAR | 1 refills | Status: AC
Start: 2017-07-31 — End: 2017-07-31

## 2017-07-31 NOTE — Telephone Encounter (Signed)
Kristina Shields called to ask Kristina Shields to give her the information they [facility] at [facility] visit about the name of the place where [facility] could get custom support bras and that could be paid for by the state. [facility] at [phone].

## 2017-08-02 NOTE — Telephone Encounter (Signed)
Please tell [facility] that she can buy a customized bra in a shop called [facility] in [place]. We will fax the order for it.

## 2017-08-06 NOTE — Telephone Encounter (Signed)
[facility]

## 2017-08-07 ENCOUNTER — Other Ambulatory Visit: Payer: Self-pay | Admitting: Primary Care

## 2017-08-07 NOTE — Telephone Encounter (Signed)
Kristina Shields was last seen: 07/30/17

## 2017-08-08 ENCOUNTER — Other Ambulatory Visit: Payer: Self-pay | Admitting: Gastroenterology

## 2017-08-09 ENCOUNTER — Telehealth: Payer: Self-pay | Admitting: Primary Care

## 2017-08-09 NOTE — Telephone Encounter (Signed)
Kristina Shields called and stated that patient went to day program and was called to pick up patient because she exposed to another person the was diagnosed with the flu. She wants to know if patient needs to be seen or can something be sent to the [facility] [place]? Can you please advise?

## 2017-08-09 NOTE — Telephone Encounter (Signed)
As long as the patient does not have any symptoms there is no need for her to be treated.  Please monitor this patient closely in case she starts to exhibit any symptoms such as fever or sore throat or any other upper respiratory tract infection type of symptoms.  If she develops any bodyache, fatigue, chills or sweats than please bring this patient immediately for evaluation.

## 2017-09-06 ENCOUNTER — Other Ambulatory Visit: Payer: Self-pay | Admitting: Primary Care

## 2017-09-06 NOTE — Telephone Encounter (Signed)
Last in 07/30/17

## 2017-09-17 ENCOUNTER — Encounter: Payer: Self-pay | Admitting: Primary Care

## 2017-09-17 ENCOUNTER — Ambulatory Visit: Payer: Medicare Other | Attending: Primary Care | Admitting: Primary Care

## 2017-09-17 VITALS — BP 140/70 | HR 68 | Temp 97.3°F | Wt 144.5 lb

## 2017-09-17 DIAGNOSIS — S81011A Laceration without foreign body, right knee, initial encounter: Secondary | ICD-10-CM

## 2017-09-17 DIAGNOSIS — S61411A Laceration without foreign body of right hand, initial encounter: Secondary | ICD-10-CM

## 2017-09-17 DIAGNOSIS — W101XXA Fall (on)(from) sidewalk curb, initial encounter: Secondary | ICD-10-CM

## 2017-09-17 NOTE — Progress Notes (Signed)
Kristina Shields is a 82 y.o. female (Jan 26, 1936)    Nurses note:   follow up from ED (hurt right hand and right knee)    Vitals:    09/17/17 0924   BP: 140/70   Pulse: 68   Temp: 36.3 C (97.3 F)   Weight: 65.5 kg (144 lb 8 oz)         CC: Fell on the curb and sustained some lacerations.    HPI: Kristina Shields is a 82 year old Caucasian lady who is brought in today by her caregiver for follow-up.  She was recently taken to the emergency department after an injury to her right hand and right knee as she tripped on a curb.  This accident happened on September 12, 2017.      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  Per her report Kristina Shields was independent so she came out of the [facility] and this caregiver was trying to help the second client, who needed some help to get out of the [place].   In the meantime Kristina Shields started to walk on the curb.  As the curb surface was undulating Kristina Shields lost her balance and tripped over.  She tried to prevent herself from falling and put her both hands forward.  In this process her right hand and right knee got lacerated but she saved herself from any further injury.  She denies any trauma to her head.  She denies any loss of consciousness.      As an urgent care was closed, she was taken to an emergency department for assessment.  They advised to do the daily dressing to the right knee until the area is fully healed  and follow up with PCP.  Today she is here for follow-up of these lacerations.  She denies any increased pain since her accident.  They have been doing daily dressing to the right knee open area and so far they have not noticed any increased redness or any malodor.  Per the report both areas appear to be healing well to them.    Her past history is significant for diabetes type 2, asthma, hypothyroidism and osteopenia.  Satisfactory   ROS  Review of systems:    HEENT: Denies any problem. Negative for hearing loss.   Endocrinologic: Denies any  problem.  No history of diabetes or hypothyroidism   Respiratory: Denies any shortness of breath, cough or wheezing  Cardiovascular: Negative for chest pain or peripheral edema.  Gastroenterologic: Negative for nausea, vomiting, constipation or diarrhea.  Denies any abdominal pain.  Urinary: Denies any problem.  Musculoskeletal:Denies any problem .    Neurological: Negative for seizure or migraine headache.   Psychiatric/behavioral:  Negative for any depression or anxiety   Dermatological: Denies any problem.  Except history of present illness.  Denies any rash or pruritus.    No Known Allergies (drug, envir, food or latex)    Current Outpatient Prescriptions   Medication    Multiple Vitamin (TAB-A-VITE) tablet    SM ASPIRIN ADULT LOW STRENGTH 81 MG EC tablet    calcium carbonate-vitamin D 600-400 MG-UNIT per tablet    VITAMIN D-3 SUPER STRENGTH 2000 units tablet    pantoprazole (PROTONIX) 40 MG EC tablet    losartan (COZAAR) 100 MG tablet    levothyroxine (SYNTHROID, LEVOTHROID) 100 MCG tablet    glimepiride (AMARYL) 1 MG tablet    DULoxetine (CYMBALTA) 30 MG DR capsule    zinc oxide 20 %  ointment    guaiFENesin (ROBITUSSIN) 100 MG/5ML syrup    atorvastatin (LIPITOR) 10 MG tablet    meloxicam (MOBIC) 15 MG tablet    metFORMIN (FORTAMET) 500 MG (OSM) 24 hr tablet    Dextromethorphan Polistirex (ROBITUSSIN 12 HOUR COUGH PO)    acetaminophen (TYLENOL) 325 MG tablet    albuterol HFA (VENTOLIN HFA) 108 (90 Base) MCG/ACT inhaler    cholecalciferol (VITAMIN D) 2000 units tablet     No current facility-administered medications for this visit.        No past medical history on file.  Social History     Social History    Marital status: Single     Spouse name: N/A    Number of children: N/A    Years of education: N/A     Occupational History    Not on file.     Social History Main Topics    Smoking status: Never Smoker    Smokeless tobacco: Never Used    Alcohol use Not on file    Drug use: Unknown     Sexual activity: Not on file     Social History Narrative    No narrative on file         Physical Exam  No acute distress.  Eyes: Clear conjunctiva.  No mucous discharge.  PERRLA  Ears: Normal pinnae.  Acuity  to conversational tones is good.    Both canals are clear.  Tympanic membranes appear gray and pearly.  Nose: Pink and moist mucosa.  No sinus tenderness.  Throat: Pink and moist mucosa, no exudate or postnasal drip.  Neck:  Trachea midline.  No thyromegaly.  No visible or palpable masses noted.  No cervical or supraclavicular lymphadenopathy  Chest: Clear to auscultate bilaterally, with good air movement and no wheezes or rales.  No accessory muscle usage.  Respirations unlabored.   Heart: S1 and S2 regular rate and rhythm.  No murmur or gallop  Abdomen: Soft, nontender and normoactive bowel sounds.  No overt hepatosplenomegaly  Lower extremities: No clubbing, cyanosis or edema.  No ulcerations or any increased warmth noted.  Skin: Exam of the distal portion of her right knee showed 3 x 1.5 cm superficial open wound which is granulating quite well.  Minimal redness of the surrounding skin.  Minimal tenderness with palpation.  No signs of secondary infection or any exudate noted.    Exam of the palmar surface of her right hand showed a 7 x 7 mm superficially open lesion.  This area also appears to be granulating quite well.  No signs of secondary infection noted.    1. Laceration of right knee, initial encounter  A large lacerated area of the right knee.  The caregiver was advised to continue daily dressing from tomorrow.  Today a fresh dressing was applied during her visit.    2. Laceration of right hand  Well-healing small lacerated area of the right hand.  Continue daily dressing to this area as well until the lesion is fully healed.  We will reevaluate her in 2 weeks to make sure both areas have healed quite well.    3. Fall (on)(from) sidewalk curb, initial encounter  Kristina Shields and her caregivers both  were explained to be much more careful when walking.  Kristina Shields was advised to wait for her caregiver and listen to her instructions when going for any appointments.  Always use any siderails if the road or sidewalk is slippery.  She was also recommended to  wear comfortable well fitting shoes in order to avoid any fall in future.        Return in about 2 weeks (around 10/01/2017).

## 2017-09-23 ENCOUNTER — Ambulatory Visit
Admission: AD | Admit: 2017-09-23 | Discharge: 2017-09-23 | Disposition: A | Discharge status: Home / Self Care | Payer: Medicare Other | Source: Ambulatory Visit | Attending: Urgent Care | Admitting: Urgent Care

## 2017-09-23 DIAGNOSIS — R21 Rash and other nonspecific skin eruption: Secondary | ICD-10-CM | POA: Insufficient documentation

## 2017-09-23 LAB — HM HIV SCREENING OFFERED

## 2017-09-23 MED ORDER — BETAMETHASONE DIPROPIONATE AUG 0.05 % EX CREAM *A*
TOPICAL_CREAM | Freq: Two times a day (BID) | CUTANEOUS | 0 refills | Status: DC | PRN
Start: 2017-09-23 — End: 2017-11-13

## 2017-09-23 NOTE — UC Provider Note (Signed)
History     Chief Complaint   Patient presents with    Rash     on neck      Small skin eruption that appeared over the weekend after wearing a newly purchased shirt. Mild irritation            Medical/Surgical/Family History     History reviewed. No pertinent past medical history.     There is no problem list on file for this patient.           History reviewed. No pertinent surgical history.  No family history on file.       Social History   Substance Use Topics    Smoking status: Never Smoker    Smokeless tobacco: Never Used    Alcohol use Not on file     Living Situation     Questions Responses    Patient lives with     Homeless     Caregiver for other family member     External Services     Employment     Domestic Violence Risk                 Review of Systems   Review of Systems    Physical Exam   Triage Vitals  Triage Start: Start, (09/23/17 1102)   First Recorded BP: 138/68, Resp: 16, Temp: 36.2 C (97.2 F), Temp src: TEMPORAL Oxygen Therapy SpO2: 100 %, Oximetry Source: Lt Hand, O2 Device: None (Room air), Heart Rate: 77, (09/23/17 1105) Heart Rate (via Pulse Ox): 77, (09/23/17 1105).      Physical Exam   Constitutional: She appears well-developed and well-nourished.   HENT:   Head: Normocephalic.   Eyes: Pupils are equal, round, and reactive to light. EOM are normal.   Skin: Skin is warm and dry. Capillary refill takes less than 2 seconds. Rash noted.   Erythematous rash noticed around neck and upper chest. Small vesicles noted some excoriated lesions   Nursing note and vitals reviewed.       Medical Decision Making        Initial Evaluation:  ED First Provider Contact     Date/Time Event User Comments    09/23/17 1057 ED First Provider Contact Jeanpierre Thebeau Initial Face to Face Provider Contact          Patient was seen on: 09/23/2017        Assessment:  82 y.o.female comes to the Urgent Care Center with a rash    Differential Diagnosis includes:  Rash    Plan:  Discharge Medication List as of  09/23/2017 11:14 AM        Discharge Medication List as of 09/23/2017 11:14 AM      START taking these medications    Details   betamethasone dipropionate augmented (DIPROLENE-AF) 0.05 % cream Apply topically 2 times daily as needed for RashDisp-15 g, R-0, Normal           Follow up with PCP next week.         Final Diagnosis  Final diagnoses:   [R21] Rash and nonspecific skin eruption (Primary)         Ayvah Caroll, PA       I have reviewed nursing documentation, confirmed information with patient, and revised with nursing as necessary.       Telford NabButton, Annastasia Haskins, [place]  09/23/17 1128

## 2017-09-23 NOTE — ED Triage Notes (Signed)
Pt is wearing a new unwashed shirt       Triage Note   Wonda AmisSarah Kelleen Stolze, LPN

## 2017-09-23 NOTE — ED Triage Notes (Signed)
Staff states pt has a rash on her neck that started this morning, staff states no new medication, food or personal care products, pt states the rash is not painful but it does itch, no otc meds or topicals have been used.       Triage Note   Kristina AmisSarah Brandey Vandalen, LPN

## 2017-10-01 ENCOUNTER — Ambulatory Visit: Payer: Medicare Other | Admitting: Primary Care

## 2017-10-02 ENCOUNTER — Ambulatory Visit: Payer: Medicare Other | Attending: Primary Care | Admitting: Primary Care

## 2017-10-02 ENCOUNTER — Encounter: Payer: Self-pay | Admitting: Primary Care

## 2017-10-02 ENCOUNTER — Encounter: Payer: Self-pay | Admitting: Gastroenterology

## 2017-10-02 VITALS — BP 140/60 | HR 72 | Temp 97.6°F | Wt 148.2 lb

## 2017-10-02 DIAGNOSIS — L309 Dermatitis, unspecified: Secondary | ICD-10-CM

## 2017-10-02 DIAGNOSIS — S81011D Laceration without foreign body, right knee, subsequent encounter: Secondary | ICD-10-CM

## 2017-10-02 NOTE — Progress Notes (Signed)
Kristina Shields is a 82 y.o. female ([birth year])    Nurses note:   follow up on knee and follow up on scratches to neck    Vitals:    10/02/17 0850   BP: 140/60   Pulse: 72   Temp: 36.4 C (97.6 F)   Weight: 67.2 kg (148 lb 3.2 oz)         CC: Follow-up of recent urgent care visit for dermatitis of the neck and knee laceration due to a recent fall.    HPI: Kristina Shields is a 82 year old lady was brought in today by her caregiver for follow-up of her recent urgent care for dermatitis of her neck.  Per her report 2 days ago is when she got up she did not have any problem on her neck but by the time she was ready to go to the program she was rubbing her anterior part of the neck and trying to scratch it hard.  Therefore she was taken to an urgent care and where she was diagnosed with dermatitis of the neck and prescribed betamethasone dipropionate augmented twice per day until the rash completely resolves    She is also here for follow-up of her recent fall and some laceration on her right knee.  Per caregiver's report her right knee has been healing quite well without any issues and she is walking at her baseline.    Kristina Shields's past history is significant for mental retardation, hypothyroidism, GERD, depression, hypertension, dyslipidemia, diabetes type 2, degenerative disease and vitamin D deficiency    ROS  Denies any nausea vomiting constipation or diarrhea.  She denies any shortness of breath or chest pain.    No Known Allergies (drug, envir, food or latex)    Current Outpatient Prescriptions   Medication    betamethasone dipropionate augmented (DIPROLENE-AF) 0.05 % cream    SM ASPIRIN ADULT LOW STRENGTH 81 MG EC tablet    calcium carbonate-vitamin D 600-400 MG-UNIT per tablet    VITAMIN D-3 SUPER STRENGTH 2000 units tablet    Multiple Vitamin (TAB-A-VITE) tablet    pantoprazole (PROTONIX) 40 MG EC tablet    losartan (COZAAR) 100 MG tablet    levothyroxine (SYNTHROID, LEVOTHROID) 100 MCG tablet    glimepiride  (AMARYL) 1 MG tablet    DULoxetine (CYMBALTA) 30 MG DR capsule    zinc oxide 20 % ointment    guaiFENesin (ROBITUSSIN) 100 MG/5ML syrup    atorvastatin (LIPITOR) 10 MG tablet    meloxicam (MOBIC) 15 MG tablet    metFORMIN (FORTAMET) 500 MG (OSM) 24 hr tablet    Dextromethorphan Polistirex (ROBITUSSIN 12 HOUR COUGH PO)    acetaminophen (TYLENOL) 325 MG tablet    albuterol HFA (VENTOLIN HFA) 108 (90 Base) MCG/ACT inhaler    cholecalciferol (VITAMIN D) 2000 units tablet     No current facility-administered medications for this visit.        No past medical history on file.  Social History     Social History    Marital status: Single     Spouse name: N/A    Number of children: N/A    Years of education: N/A     Occupational History    Not on file.     Social History Main Topics    Smoking status: Never Smoker    Smokeless tobacco: Never Used    Alcohol use Not on file    Drug use: Unknown    Sexual activity: Not on file     Social  History Narrative    No narrative on file         Physical Exam  No acute distress.  Eyes: Clear conjunctiva.  No mucous discharge.  PERRLA  Ears: Normal pinnae.  Acuity  to conversational tones is good.    Both canals are clear.  Tympanic membranes appear gray and pearly.  Nose: Pink and moist mucosa.  No sinus tenderness.  Throat: Pink and moist mucosa, no exudate or postnasal drip.  Neck:  Trachea midline.  No thyromegaly.  No visible or palpable masses noted.  No cervical or supraclavicular lymphadenopathy  Chest: Clear to auscultate bilaterally, with good air movement and no wheezes or rales.  No accessory muscle usage.  Respirations unlabored.   Heart: S1 and S2 regular rate and rhythm.  No murmur or gallop  Abdomen: Soft, nontender and normoactive bowel sounds.  No overt hepatosplenomegaly  Lower extremities: No clubbing, cyanosis or edema.  No ulcerations or any increased warmth noted.  Skin: Exam of her anterior and posterior neck showed normal clear skin with  minimal erythematous patches on the anterior distal part of her neck  Exam of her anterior right knee showed a postinflammatory hyperpigmented patch.  She does not have any more scabs or any open ulcerations.           1. Laceration of right knee, subsequent encounter  Well-healed lacerated area of the right knee.  Discontinue daily dressing no further treatment required in this area    2. Dermatitis, unspecified  Continue betamethasone cream to the anterior part of her neck until that erythematous patch is completely dissolved.    Follow-up as needed        Return in about 2 months (around 12/02/2017).

## 2017-10-28 ENCOUNTER — Ambulatory Visit: Payer: Medicare Other | Admitting: Primary Care

## 2017-10-28 ENCOUNTER — Encounter: Payer: Self-pay | Admitting: Gastroenterology

## 2017-10-28 LAB — HM DIABETES FOOT EXAM

## 2017-10-31 ENCOUNTER — Ambulatory Visit: Payer: Medicare Other | Admitting: Primary Care

## 2017-11-13 ENCOUNTER — Encounter: Payer: Self-pay | Admitting: Primary Care

## 2017-11-13 ENCOUNTER — Ambulatory Visit: Payer: Medicare Other | Attending: Primary Care | Admitting: Primary Care

## 2017-11-13 VITALS — BP 140/60 | HR 72 | Temp 97.8°F | Ht 63.0 in | Wt 149.7 lb

## 2017-11-13 DIAGNOSIS — E039 Hypothyroidism, unspecified: Secondary | ICD-10-CM

## 2017-11-13 DIAGNOSIS — F32A Depression, unspecified: Secondary | ICD-10-CM

## 2017-11-13 DIAGNOSIS — Z0000 Encounter for general adult medical examination without abnormal findings: Secondary | ICD-10-CM

## 2017-11-13 DIAGNOSIS — E119 Type 2 diabetes mellitus without complications: Secondary | ICD-10-CM

## 2017-11-13 DIAGNOSIS — F329 Major depressive disorder, single episode, unspecified: Secondary | ICD-10-CM

## 2017-11-13 DIAGNOSIS — I10 Essential (primary) hypertension: Secondary | ICD-10-CM

## 2017-11-13 DIAGNOSIS — K219 Gastro-esophageal reflux disease without esophagitis: Secondary | ICD-10-CM

## 2017-11-13 MED ORDER — ZOSTER VAC RECOMB ADJUVANTED 50 MCG IM SUSR *I*
50.0000 ug | Freq: Once | INTRAMUSCULAR | 1 refills | Status: AC
Start: 2017-11-13 — End: 2017-11-13

## 2017-11-13 NOTE — Progress Notes (Signed)
Kristina Shields is a 82 y.o. female (1936-02-04)    Nurses note:   review medication and Subsequent Annual Medicare Visit    Vitals:    11/13/17 0920   BP: 140/60   Pulse: 72   Temp: 36.6 C (97.8 F)   Weight: 67.9 kg (149 lb 11.2 oz)   Height: 1.6 m ( )         CC: Follow-up of hypothyroidism, GERD, depression, hypertension, dyslipidemia, diabetes type 2, vitamin D deficiency and degenerative disease    HPI: Kristina Shields is a 82 year old Caucasian lady who presented today for follow-up of her chronic conditions.  She suffers from hypothyroidism, GERD, depression, hypertension, dyslipidemia, diabetes type 2, vitamin D deficiency and degenerative disease.  Per her report she has been doing quite well.  Her caregiver thinks that she has been doing quite well.  She voices no new concerns for today.  Per her report she does not have any upper respiratory tract infection type of symptoms neither she has any urinary symptoms.  Denies any pain or aches in her body.  Her diabetes type 2 is generally under good control with Amaryl 1 mg tablet half tablet daily.  Denies any episodes of hypoglycemia.  For her depression she is taking duloxetine 30 mg daily.  Her caregiver feels that she is most of the time in a good mood.  She denies any difficulty with sleeping.  She does not appear agitated or frazzled to her caregiver.  Kristina Shields denies any tiredness or sad mood.    For hypothyroidism currently she is on levothyroxine 100 g daily.  For her blood pressure she is taking losartan 100 mg daily.  Denies any dizziness or lightheadedness.  She denies any headache.  For GERD she takes Protonix 40 mg daily with positive results.  She denies any GERD-like symptoms.  For vitamin D deficiency she is on vitamin D 2000 IU daily plus calcium with vitamin D 600/400 mg/units per day  ROS  Review of systems:    HEENT: Denies any problem. Negative for hearing loss.   Endocrinologic: Denies any problem.  Positive history of diabetes and  hypothyroidism   Respiratory: Denies any shortness of breath, cough or wheezing  Cardiovascular: Negative for chest pain or peripheral edema.  Gastroenterologic: Negative for nausea, vomiting, constipation or diarrhea.  Denies any abdominal pain.  Urinary: Denies any problem.  Musculoskeletal:Denies any problem .    Neurological: Negative for seizure or migraine headache.   Psychiatric/behavioral:  Depression and anxiety under good control  Dermatological: Denies any problem.  Denies any rash or pruritus.    No Known Allergies (drug, envir, food or latex)    Current Outpatient Prescriptions   Medication    neomycin-bacitracin-polymyxin (NEOSPORIN) ointment    SM ASPIRIN ADULT LOW STRENGTH 81 MG EC tablet    calcium carbonate-vitamin D 600-400 MG-UNIT per tablet    Multiple Vitamin (TAB-A-VITE) tablet    pantoprazole (PROTONIX) 40 MG EC tablet    losartan (COZAAR) 100 MG tablet    levothyroxine (SYNTHROID, LEVOTHROID) 100 MCG tablet    glimepiride (AMARYL) 1 MG tablet    DULoxetine (CYMBALTA) 30 MG DR capsule    zinc oxide 20 % ointment    guaiFENesin (ROBITUSSIN) 100 MG/5ML syrup    acetaminophen (TYLENOL) 325 MG tablet    cholecalciferol (VITAMIN D) 2000 units tablet     No current facility-administered medications for this visit.        No past medical history on file.  Social History  Social History    Marital status: Single     Spouse name: N/A    Number of children: N/A    Years of education: N/A     Occupational History    Not on file.     Social History Main Topics    Smoking status: Never Smoker    Smokeless tobacco: Never Used    Alcohol use Not on file    Drug use: Unknown    Sexual activity: Not on file     Social History Narrative    No narrative on file         Physical Exam  No acute distress.  Eyes: Clear conjunctiva.  No mucous discharge.  PERRLA  Ears: Normal pinnae.  Acuity  to conversational tones is good.    Both canals are clear.  Tympanic membranes appear gray and  pearly.  Nose: Pink and moist mucosa.  No sinus tenderness.  Throat: Pink and moist mucosa, no exudate or postnasal drip.  Neck:  Trachea midline.  No thyromegaly.  No visible or palpable masses noted.  No cervical or supraclavicular lymphadenopathy  Chest: Clear to auscultate bilaterally, with good air movement and no wheezes or rales.  No accessory muscle usage.  Respirations unlabored.   Heart: S1 and S2 regular rate and rhythm.  No murmur or gallop  Abdomen: Soft, nontender and normoactive bowel sounds.  No overt hepatosplenomegaly  Lower extremities: No clubbing, cyanosis or edema.  No ulcerations or any increased warmth noted.  Musculoskeletal: Full range of motion without any increased pain.  No joint effusion or erythema noted   Neuro: Alert and oriented 3.  Strength 5 out of 5 in all 4 extremities.  Cranial nerves II through XII are grossly intact.  DTRs 1+ in both knees    Skin: In general her skin appeared pink warm and intact.  No ulcerations or open areas noted.  Lacerated areas of her both knees have healed quite well without any complications.  Psych: Normal mood and affect.  Normal thought process.      1. Acquired hypothyroidism  She appears to be quite stable without any signs of fatigue, constipation or dry skin.  Advised her to continue with levothyroxine 100 g daily.  We will check her TSH and T4 in August or September 2019       Lab results: 07/27/17  0710   TSH 2.20       2. Essential hypertension  Her blood pressure appears to be under excellent control.  Continue losartan 100 mg daily.      3. DM2 (diabetes mellitus, type 2)  Continue Amaryl 0.5 mg twice per day.  Advised her to stay on low carb diet as much as possible.  Advised her to lose some weight to get her BMI below 25.  She verbalized understanding of it.  Lab Results   Component Value Date    HA1C 5.8 (H) 07/27/2017   We will check her hemoglobin A1c again in August or September    4. Gastroesophageal reflux disease without  esophagitis  Continue Protonix 40 mg daily.  Advised her to stay on low-fat and non-spicy food    5. Depression  Continue Cymbalta 30 mg daily.  Currently this dose is enough to control her symptoms.          Return in about 3 months (around 02/13/2018).

## 2017-11-13 NOTE — Progress Notes (Signed)
Today we reviewed and updated Kristina Shields smoking status, activities of daily living, depression screen, fall risk, medications and allergies.   I have counseled the patient in the above areas.     Subjective:     Chief Complaint: Kristina Shields is a 82 y.o. female here for a/an review medication and Subsequent Annual Medicare Visit    In general, Kristina Shields rates their overall health as:  good    Patient Care Team:  Viona Gilmore, MD as PCP - General (Primary Care)  Frances Maywood, NP as PCP - [facility]   (Primary Care)  Asa Saunas, Haynes Dage, MD (Primary Care)  Frances Maywood, NP (Primary Care)     There are no active problems to display for this patient.    No past medical history on file.  No past surgical history on file.  No family history on file.  Social History     Social History    Marital status: Single     Spouse name: N/A    Number of children: N/A    Years of education: N/A     Occupational History    Not on file.     Social History Main Topics    Smoking status: Never Smoker    Smokeless tobacco: Never Used    Alcohol use Not on file    Drug use: Unknown    Sexual activity: Not on file     Social History Narrative    No narrative on file       Objective:     Vital Signs: BP 140/60    Pulse 72    Temp 36.6 C (97.8 F) (Temporal)    Ht 1.6 m ( )    Wt 67.9 kg (149 lb 11.2 oz)    BMI 26.52 kg/m    BMI: Body mass index is 26.52 kg/m.    Vision Screening Results (Welcome visit only):  No acute distress.  Slightly overweight  Eyes: Clear conjunctiva.  No mucous discharge.  PERRLA  Ears: Normal pinnae.  Acuity  to conversational tones is good.    Both canals are clear.  Tympanic membranes appear gray and pearly.  Nose: Pink and moist mucosa.  No sinus tenderness.  Throat: Pink and moist mucosa, no exudate or postnasal drip.  Neck:  Trachea midline.  No thyromegaly.  No visible or palpable masses noted.  No cervical or supraclavicular lymphadenopathy  Chest:  Clear to auscultate bilaterally, with good air movement and no wheezes or rales.  No accessory muscle usage.  Respirations unlabored.   Heart: S1 and S2 regular rate and rhythm.  No murmur or gallop  Abdomen: Soft, nontender and normoactive bowel sounds.  No overt hepatosplenomegaly  Lower extremities: No clubbing, cyanosis or edema.  No ulcerations or any increased warmth noted.             Assessment and Plan:      Cognitive Function:  Recall of recent and remote events appears:  Normal    The following health maintenance plan was reviewed with the patient:    A detailed discussion regarding weight control was done with her urine she was advised to reduce her weight slightly so her BMI can be between 18-25.  She verbalized understanding of it.  She was advised to stay on low carb and low-fat diet.    Health Maintenance   Topic Date Due    IMM-PREVNAR VACCINE 65 + YRS (1) 06/05/2003    IMM-PNEUMOVAX VACCINE  65 + YRS (1) 06/05/2003    IMM-ZOSTER (2 of 3) 08/24/2010    URINE MICROALBUMIN  05/08/2018 (Originally 06/04/1946)    HEMOGLOBIN A1C  01/24/2018    LIPID DISORDER SCREENING  07/27/2018    Fall Risk Screening  09/18/2018    FOOT EXAM  10/29/2018    OPHTHALMOLOGY EXAM  02/08/2019    HIV TESTING OFFERED  Completed    IMM-INFLUENZA  Completed     This health maintenance schedule, identified risks, a list of orders placed today and patient goals have been provided to Kristina Shields in the after visit summary.

## 2017-11-13 NOTE — Patient Instructions (Signed)
Thank you for completing your review medication and Subsequent Annual Medicare Visit   with [place] today.     The purpose of this visits was to:     Screen for disease   Assess risk of future medical problems   Help develop a healthy lifestyle   Update vaccines   Get to know your doctor in case of an illness    Patient Care Team:  Viona Gilmore, MD as PCP - General (Primary Care)  Frances Maywood, NP as PCP - [facility]   (Primary Care)  Viona Gilmore, MD (Primary Care)  Frances Maywood, NP (Primary Care)       The following items were identified as areas of concern during your screening today:  BMI greater than 25 - This is a risk for Heart Attack, Stroke, High Blood Pressure, Diabetes, High Cholesterol and other complications.       The Health Maintenance table below identifies screening tests and immunizations recommended by your health care team:  Health Maintenance   Topic Date Due    IMM-PREVNAR VACCINE 65 + YRS (1) 06/05/2003    IMM-PNEUMOVAX VACCINE 65 + YRS (1) 06/05/2003    IMM-ZOSTER (2 of 3) 08/24/2010    URINE MICROALBUMIN  05/08/2018 (Originally 06/04/1946)    HEMOGLOBIN A1C  01/24/2018    LIPID DISORDER SCREENING  07/27/2018    Fall Risk Screening  09/18/2018    FOOT EXAM  10/29/2018    OPHTHALMOLOGY EXAM  02/08/2019    HIV TESTING OFFERED  Completed    IMM-INFLUENZA  Completed     In addition, goals and orders placed to address these recommendations are listed above.    We wish you the best of health and look forward to seeing you again next year for your Annual Medicare Wellness Visit.     If you have any health care concerns before then, please do not hesitate to contact us.

## 2017-11-24 ENCOUNTER — Other Ambulatory Visit: Payer: Self-pay | Admitting: Primary Care

## 2017-11-30 ENCOUNTER — Other Ambulatory Visit: Payer: Self-pay | Admitting: Primary Care

## 2018-01-06 ENCOUNTER — Other Ambulatory Visit: Payer: Self-pay | Admitting: Primary Care

## 2018-01-06 ENCOUNTER — Encounter: Payer: Self-pay | Admitting: Gastroenterology

## 2018-01-06 NOTE — Telephone Encounter (Signed)
Kristina ElmSylvia was last seen: 11/13/17 with Kristina FailAnita

## 2018-01-07 ENCOUNTER — Telehealth: Payer: Self-pay | Admitting: Primary Care

## 2018-01-07 DIAGNOSIS — Z1239 Encounter for other screening for malignant neoplasm of breast: Secondary | ICD-10-CM

## 2018-01-07 NOTE — Telephone Encounter (Signed)
Orders in to be signed, usually sees [place].

## 2018-01-07 NOTE — Telephone Encounter (Signed)
[facility] they need a requisition for Right breast diagnositc mammogram and ultrasound for  6 month follow up .

## 2018-01-07 NOTE — Telephone Encounter (Signed)
This patient called the office requesting a mammogram and [name]'s absence.  These were ordered.

## 2018-01-08 ENCOUNTER — Telehealth: Payer: Self-pay | Admitting: Primary Care

## 2018-01-08 NOTE — Telephone Encounter (Signed)
For the diagnostic mammo and ultrasound, Kristina Shields said that the diagnosis needs to be changed on both orders. It needs to be for Abnormal Mammogram ICD-10= R92.8. ICD-9= 793.80. Can you please change the diagnosis and I will fax [facility] req [facility]

## 2018-01-08 NOTE — Telephone Encounter (Signed)
Can you change the codes on those orders? I can't input from my side.

## 2018-01-09 ENCOUNTER — Other Ambulatory Visit: Payer: Self-pay | Admitting: Primary Care

## 2018-01-09 DIAGNOSIS — R928 Other abnormal and inconclusive findings on diagnostic imaging of breast: Secondary | ICD-10-CM

## 2018-01-09 NOTE — Telephone Encounter (Signed)
done

## 2018-01-10 NOTE — Telephone Encounter (Signed)
Codes fixed, please resend

## 2018-01-10 NOTE — Telephone Encounter (Signed)
Faxed to [facility]

## 2018-01-14 ENCOUNTER — Encounter: Payer: Self-pay | Admitting: Gastroenterology

## 2018-02-01 ENCOUNTER — Other Ambulatory Visit: Payer: Self-pay

## 2018-02-01 LAB — CBC AND DIFFERENTIAL
Baso # K/uL: 0 10*3/uL (ref 0.0–0.2)
Basophil %: 0 % (ref 0–3)
Eos # K/uL: 0 10*3/uL (ref 0.0–0.6)
Eosinophil %: 0 % (ref 0–5)
Hematocrit: 33 % — ABNORMAL LOW (ref 35–47)
Hemoglobin: 10.9 g/dL — ABNORMAL LOW (ref 12.0–16.0)
Lymph # K/uL: 0.7 10*3/uL — ABNORMAL LOW (ref 1.0–4.8)
Lymphocyte %: 6 % — ABNORMAL LOW (ref 15–45)
MCH: 30 pg (ref 26.0–34.0)
MCHC: 33 g/dL (ref 31.0–37.5)
MCV: 91 fL (ref 80–100)
Mono # K/uL: 0.8 10*3/uL (ref 0.1–1.0)
Monocyte %: 6 % (ref 0–15)
Neut # K/uL: 10.9 10*3/uL — ABNORMAL HIGH (ref 1.8–8.0)
Platelets: 295 10*3/uL (ref 150–450)
RBC: 3.63 10*6/uL — ABNORMAL LOW (ref 3.80–5.20)
RDW: 14.7 % (ref 0.0–15.2)
Seg Neut %: 87 % — ABNORMAL HIGH (ref 45–75)
WBC: 12.4 10*3/uL — ABNORMAL HIGH (ref 4.0–11.0)

## 2018-02-01 LAB — COMPREHENSIVE METABOLIC PANEL
A/G RATIO: 0.7 — ABNORMAL LOW (ref 1.1–1.8)
ALT: 17 U/L — ABNORMAL LOW (ref 20–65)
AST: 14 U/L (ref 7–37)
Albumin: 3.2 g/dL (ref 3.2–5.0)
Alk Phos: 100 U/L (ref 30–135)
Anion Gap: 5 — ABNORMAL LOW (ref 7–16)
Bilirubin,Total: 0.4 mg/dL (ref 0.0–1.0)
CO2: 31 mEq/L (ref 21–32)
Calcium: 9.4 mg/dL (ref 8.5–10.2)
Chloride: 102 mEq/L (ref 98–108)
Creatinine: 0.7 mg/dL (ref 0.5–0.9)
Globulin: 4.4 g/dL (ref 2.7–4.6)
Glucose: 260 mg/dL — ABNORMAL HIGH (ref 65–100)
Lab: 17 mg/dL (ref 8–20)
Potassium: 4.1 mEq/L (ref 3.2–5.0)
Sodium: 138 mEq/L (ref 135–145)
Total Protein: 7.6 g/dL (ref 5.7–8.2)

## 2018-02-01 LAB — ESTIMATED GFR
GFR,Black: >60 mL/min
GFR,Caucasian: >60 mL/min

## 2018-02-01 LAB — UNMAPPED LAB RESULTS
Basophil # (HT): 0 10 3/uL — NL (ref 0.0–0.2)
Basophil % (HT): 0 % — NL (ref 0–3)
Eosinophil # (HT): 0 10 3/uL — NL (ref 0.0–0.6)
Eosinophil % (HT): 0 % — NL (ref 0–5)
Hematocrit (HT): 33 % — ABNORMAL LOW (ref 35–47)
Hemoglobin (HGB) (HT): 10.9 g/dL — ABNORMAL LOW (ref 12.0–16.0)
Lymphocyte # (HT): 0.7 10 3/uL — ABNORMAL LOW (ref 1.0–4.8)
Lymphocyte % (HT): 6 % — ABNORMAL LOW (ref 15–45)
MCHC (HT): 33 g/dL — NL (ref 31.0–37.5)
MCV (HT): 91 fL — NL (ref 80–100)
Mean Corpuscular Hemoglobin (MCH) (HT): 30 pg — NL (ref 26.0–34.0)
Monocyte # (HT): 0.8 10 3/uL — NL (ref 0.1–1.0)
Monocyte % (HT): 6 % — NL (ref 0–15)
Neutrophil # (HT): 10.9 10 3/uL — ABNORMAL HIGH (ref 1.8–8.0)
Platelets (HT): 295 10 3/uL — NL (ref 150–450)
RBC (HT): 3.63 10 6/uL — ABNORMAL LOW (ref 3.80–5.20)
RDW (HT): 14.7 % — NL (ref 0.0–15.2)
Seg Neut % (HT): 87 % — ABNORMAL HIGH (ref 45–75)
WBC (HT): 12.4 10 3/uL — ABNORMAL HIGH (ref 4.0–11.0)

## 2018-02-02 ENCOUNTER — Other Ambulatory Visit: Payer: Self-pay | Admitting: Primary Care

## 2018-02-03 NOTE — Telephone Encounter (Signed)
[name] was last seen: 11/13/17

## 2018-02-04 ENCOUNTER — Encounter: Payer: Self-pay | Admitting: Gastroenterology

## 2018-02-05 ENCOUNTER — Ambulatory Visit: Payer: Medicare Other | Attending: Primary Care | Admitting: Primary Care

## 2018-02-05 ENCOUNTER — Encounter: Payer: Self-pay | Admitting: Primary Care

## 2018-02-05 ENCOUNTER — Other Ambulatory Visit: Payer: Self-pay | Admitting: Primary Care

## 2018-02-05 VITALS — BP 122/66 | HR 77 | Temp 97.7°F | Ht 63.0 in | Wt 149.2 lb

## 2018-02-05 DIAGNOSIS — W1830XA Fall on same level, unspecified, initial encounter: Secondary | ICD-10-CM

## 2018-02-05 DIAGNOSIS — I952 Hypotension due to drugs: Secondary | ICD-10-CM

## 2018-02-05 DIAGNOSIS — R42 Dizziness and giddiness: Secondary | ICD-10-CM

## 2018-02-05 DIAGNOSIS — S52124A Nondisplaced fracture of head of right radius, initial encounter for closed fracture: Secondary | ICD-10-CM

## 2018-02-05 LAB — POCT URINALYSIS DIPSTICK
Bilirubin,Ur: NEGATIVE
Blood,UA POCT: NEGATIVE
Glucose,UA POCT: NORMAL mg/dL
Ketones,UA POCT: NEGATIVE mg/dL
Leuk Esterase,UA POCT: NEGATIVE
Lot #: 36253602
Nitrite,UA POCT: NEGATIVE
PH,UA POCT: 5 (ref 5–8)
Protein,UA POCT: NEGATIVE mg/dL
Specific gravity,UA POCT: 1.01 (ref 1.002–1.030)
Urobilinogen,UA: NORMAL mg/dL

## 2018-02-05 LAB — CBC AND DIFFERENTIAL
Baso # K/uL: 0.1 10*3/uL (ref 0.0–0.2)
Basophil %: 0.7 % (ref 0.0–1.8)
Eos # K/uL: 0.1 10*3/uL (ref 0.0–0.5)
Eosinophil %: 1.9 % (ref 0.0–7.0)
Hematocrit: 29.5 % — ABNORMAL LOW (ref 34.0–47.0)
Hemoglobin: 9.9 g/dL — ABNORMAL LOW (ref 11.5–16.0)
Lymph # K/uL: 1.5 10*3/uL (ref 0.9–3.8)
Lymphocyte %: 22 % (ref 17.0–44.0)
MCH: 29.6 PG (ref 26.0–34.0)
MCHC: 33.6 g/dL (ref 32.0–36.0)
MCV: 88.3 FL (ref 81.0–99.0)
Mono # K/uL: 0.6 10*3/uL (ref 0.2–1.0)
Monocyte %: 8.4 % (ref 4.0–12.0)
Neut # K/uL: 4.4 10*3/uL (ref 1.5–7.7)
Platelets: 347 10*3/uL (ref 140–400)
RBC: 3.34 10*6/uL — ABNORMAL LOW (ref 3.80–5.20)
RDW: 12.8 % (ref 11.5–15.0)
Seg Neut %: 67 % (ref 40.0–75.0)
WBC: 6.7 10*3/uL (ref 4.0–10.8)

## 2018-02-05 LAB — COMPREHENSIVE METABOLIC PANEL
A/G RATIO: 1.6 (ref 1.1–1.8)
ALT: 25 U/L (ref 1–33)
AST: 21 U/L (ref 14–34)
Albumin: 3.7 g/dL (ref 3.5–5.2)
Alk Phos: 88 U/L (ref 53–141)
Bilirubin,Total: 0.3 mg/dL (ref 0.3–1.2)
CO2: 31 mmol/L (ref 20–31)
Calcium: 8.3 mg/dL — ABNORMAL LOW (ref 8.6–10.2)
Chloride: 102 mmol/L (ref 99–109)
Creatinine: 0.74 mg/dL (ref 0.50–1.10)
Globulin: 2.3 GM/DL (ref 1.6–3.6)
Glucose: 114 MG/DL (ref 60–140)
Lab: 16 mg/dL (ref 9–23)
Potassium: 4.5 mmol/L (ref 3.5–5.1)
Sodium: 139 mmol/L (ref 136–145)
Total Protein: 6 g/dL (ref 5.7–8.2)
UN/Creat Ratio: 22 — ABNORMAL HIGH (ref 10–20)

## 2018-02-05 LAB — ESTIMATED GFR
GFR,Black: >60 mL/min/{1.73_m2} (ref >=60)
GFR,Caucasian: >60 mL/min/{1.73_m2} (ref >=60)

## 2018-02-05 LAB — UNMAPPED LAB RESULTS
Basophil # (HT): 0.1 10 3/uL — NL (ref 0.0–0.2)
Basophil % (HT): 0.7 % — NL (ref 0.0–1.8)
Eosinophil # (HT): 0.1 10 3/uL — NL (ref 0.0–0.5)
Eosinophil % (HT): 1.9 % — NL (ref 0.0–7.0)
Hematocrit (HT): 29.5 % — ABNORMAL LOW (ref 34.0–47.0)
Hemoglobin (HGB) (HT): 9.9 g/dL — ABNORMAL LOW (ref 11.5–16.0)
Lymphocyte # (HT): 1.5 10 3/uL — NL (ref 0.9–3.8)
Lymphocyte % (HT): 22 % — NL (ref 17.0–44.0)
MCHC (HT): 33.6 g/dL — NL (ref 32.0–36.0)
MCV (HT): 88.3 FL — NL (ref 81.0–99.0)
Mean Corpuscular Hemoglobin (MCH) (HT): 29.6 pg — NL (ref 26.0–34.0)
Monocyte # (HT): 0.6 10 3/uL — NL (ref 0.2–1.0)
Monocyte % (HT): 8.4 % — NL (ref 4.0–12.0)
Neutrophil # (HT): 4.4 10 3/uL — NL (ref 1.5–7.7)
Platelets (HT): 347 10 3/uL — NL (ref 140–400)
RBC (HT): 3.34 10 6/uL — ABNORMAL LOW (ref 3.80–5.20)
RDW (HT): 12.8 % — NL (ref 11.5–15.0)
Seg Neut % (HT): 67 % — NL (ref 40.0–75.0)
WBC (HT): 6.7 10 3/uL — NL (ref 4.0–10.8)

## 2018-02-05 LAB — TSH: TSH: 4.16 u[IU]/mL (ref 0.55–4.78)

## 2018-02-05 NOTE — Progress Notes (Signed)
Kristina Shields is a 82 y.o. female (11-19-1935)    Nurses note:   Follow-up (from ED from fall she had, hurt elbow, no pain today )    Vitals:    02/05/18 0924   BP: 122/66   Pulse: 77   Temp: 36.5 C (97.7 F)   Weight: 67.7 kg (149 lb 3.2 oz)   Height: 1.6 m ('5\' 3"' )         CC: Follow-up of recent emergency department visit related to a fracture of right elbow and recent fall due to dizziness.     HPI: Kristina Shields is a 82 year old Caucasian lady who was brought in today by her caregiver as she lives with her caregiver and goes to a day program from Monday to Friday.  She suffers from mental retardation, vitamin D deficiency, diabetes type 2, hypertension, GERD, osteopenia, mild depression and hypothyroidism.    Per caregiver's report last Friday when she was in her day program, she fell.  She was sitting on a chair in the afternoon then she decided to get up and go.  She started to walk in the hallway.  Per caregiver's report she was told that she fell in the hallway and no one witnessed this fall.  Per Kristina Shields's report she felt dizzy that's why she fell.  She denies tripping over anything.    On Saturday morning her caregiver noticed that she is favoring her right arm.  She had some tenderness in her right elbow therefore she was taken to an emergency department for further investigation.  Her x-ray of her right arm, right knee and right shoulder was done.  She had a small hairline fracture on the head of the right radius.  The fracture was nondisplaced.  No other fractures noted anywhere else.  Her chest x-ray and EKG was also negative.  Her white blood count was slightly elevated.Marland Kitchen She was discharged home after few hours of examining the emergency department.    Kristina Shields because report her pain in the right elbow is quite tolerable.  She is taking Tylenol as needed basis.  She was also monitored by an orthopedic physician during the emergency department visit who advised her to keep her right arm in a sling for  support and he will reevaluate her in 2 weeks.    Per Kristina Shields's report she does not feel dizzy all the time but only occasionally.  Says when she gets up from her bed and starts to walk occasionally she does get a little dizzy.  Currently she is not feeling dizzy.  Denies any feeling of room spinning around her .  Her white blood count was 12.4 on August 17 in the emergency department and her RBC was 3.63, hemoglobin 10.9 and hematocrit 33          Lab results: 02/05/18  1055   WBC 6.7   Hemoglobin 9.9*   Hematocrit 29.5*   RBC 3.34*   Platelets 347   Seg Neut % 67.0   Lymphocyte % 22.0   Monocyte % 8.4   Eosinophil % 1.9   Basophil % 0.7         Lab results: 02/05/18  1055   Sodium 139   Potassium 4.5   Chloride 102   CO2 31   UN 16   Creatinine 0.74   GFR,Caucasian >60   GFR,Black >60   Glucose 114   Calcium 8.3*   Total Protein 6.0   Albumin 3.7   ALT 25   AST  21   Alk Phos 88   Bilirubin,Total 0.3         ROS  Review of systems:    HEENT: Denies any problem. Negative for hearing loss.   Endocrinologic: Denies any problem.  Positive history of diabetes and hypothyroidism   Respiratory: Denies any shortness of breath, cough or wheezing  Cardiovascular: Negative for chest pain or peripheral edema.  Positive for hypertension  Gastroenterologic: Negative for nausea, vomiting, constipation or diarrhea.  Denies any abdominal pain.  Positive for GERD  Urinary: Denies any problem.  Musculoskeletal:Denies any problem .  Positive for osteopenia.  Recent fracture of the right elbow  Neurological: Negative for seizure or migraine headache.   Psychiatric/behavioral:  Negative for any depression or anxiety   Dermatological: Denies any problem.  Denies any rash or pruritus.    No Known Allergies (drug, envir, food or latex)    Current Outpatient Prescriptions   Medication    calcium carbonate-vitamin D 600-400 MG-UNIT per tablet    pantoprazole (PROTONIX) 40 MG EC tablet    levothyroxine (SYNTHROID, LEVOTHROID) 100 MCG tablet     losartan (COZAAR) 100 MG tablet    neomycin-bacitracin-polymyxin (NEOSPORIN) ointment    SM ASPIRIN ADULT LOW STRENGTH 81 MG EC tablet    Multiple Vitamin (TAB-A-VITE) tablet    glimepiride (AMARYL) 1 MG tablet    DULoxetine (CYMBALTA) 30 MG DR capsule    zinc oxide 20 % ointment    guaiFENesin (ROBITUSSIN) 100 MG/5ML syrup    acetaminophen (TYLENOL) 325 MG tablet    cholecalciferol (VITAMIN D) 2000 units tablet     No current facility-administered medications for this visit.        No past medical history on file.  Social History     Social History    Marital status: Single     Spouse name: N/A    Number of children: N/A    Years of education: N/A     Occupational History    Not on file.     Social History Main Topics    Smoking status: Never Smoker    Smokeless tobacco: Never Used    Alcohol use Not on file    Drug use: Unknown    Sexual activity: Not on file     Social History Narrative    No narrative on file         Physical Exam  No acute distress.  Eyes: Clear conjunctiva.  No mucous discharge.  PERRLA  Ears: Normal pinnae.  Acuity  to conversational tones is good.    Both canals are clear.  Tympanic membranes appear gray and pearly.  Nose: Pink and moist mucosa.  No sinus tenderness.  Throat: Pink and moist mucosa, no exudate or postnasal drip.  Neck:  Trachea midline.  No thyromegaly.  No visible or palpable masses noted.  No cervical or supraclavicular lymphadenopathy  Chest: Clear to auscultate bilaterally, with good air movement and no wheezes or rales.  No accessory muscle usage.  Respirations unlabored.   Heart: S1 and S2 regular rate and rhythm.  No murmur or gallop.  Blood pressure recheck---  lying position 138/68, sitting 136/70 and standing 126/66  Abdomen: Soft, nontender and normoactive bowel sounds.  No overt hepatosplenomegaly  Lower extremities: No clubbing, cyanosis or edema.  No ulcerations or any increased warmth noted.  Neuro: Alert and oriented 3.  Strength 5 out  of 5 in all 4 extremities.  Musculoskeletal: Exam of her right arm showed mild ecchymotic  patch and edema around her right elbow.  Her right arm is in a sling.  She is walking without any limp.  No other signs of injury noted           1. Dizziness and giddiness  Her dizziness could be multifactorial.  We will screen her for AAA, carotid stenosis and UTI.  UA was done in the office which was negative for any UTI.  We will also order CBC, TSH and CMP for completeness.  Although she denies any palpitations.  Ultrasound of bilateral carotid arteries and AAA screening was ordered.    Her exam was negative for any upper respiratory tract infections.  If these results are all negative then we will send her for cardiology workup.  - CV AAA Screening; Future  - CV US Carotid Bilateral; Future  - Comprehensive metabolic panel; Future  - CBC and differential; Future  - TSH; Future  - POCT urinalysis dipstick    2. Closed nondisplaced fracture of head of right radius, initial encounter  She has positive history of fracture of the right radius.  She was advised to continue using the sling for her right arm support and follow-up with the orthopedic for further instructions and treatment    3. Fall on same level, initial encounter  Multiple safety issues were discussed with her and her caregiver.  Her caregiver was advised not to have any loose rugs in the house where she can fall.  Also plenty of flighting in the house so she can see where she is going.  Kristina Shields was also advised to sit at the edge of the bed for a few minutes before getting up from her bed in the morning.  Any time she feels dizzy she should sit down immediately.  Advised her that position change should be slowly,  avoid any sudden movements of the head.  - POCT urinalysis dipstick    4. Hypotension due to drugs  Orthostatic blood pressure check showed gentle drop in her systolic blood pressure with position change.  It is possible that her blood pressure dropped  that time when she felt dizzy and fell .  Therefore we will try to reduce the dose of her losartan from 100-50 mg daily and see whether that improves her dizziness or not.  In the meantime we will also monitor her blood pressure for any hypertensive episodes.       Follow-up in 2 weeks.        Return in about 2 weeks (around 02/19/2018).

## 2018-02-07 ENCOUNTER — Encounter: Payer: Self-pay | Admitting: Gastroenterology

## 2018-02-12 ENCOUNTER — Other Ambulatory Visit: Payer: Self-pay | Admitting: Gastroenterology

## 2018-02-18 ENCOUNTER — Encounter: Payer: Self-pay | Admitting: Gastroenterology

## 2018-02-20 ENCOUNTER — Telehealth: Payer: Self-pay | Admitting: Primary Care

## 2018-02-20 ENCOUNTER — Encounter: Payer: Self-pay | Admitting: Primary Care

## 2018-02-20 ENCOUNTER — Ambulatory Visit: Payer: Medicare Other | Attending: Primary Care | Admitting: Primary Care

## 2018-02-20 VITALS — BP 130/60 | HR 88 | Temp 98.1°F | Ht 61.0 in | Wt 148.5 lb

## 2018-02-20 DIAGNOSIS — K219 Gastro-esophageal reflux disease without esophagitis: Secondary | ICD-10-CM

## 2018-02-20 DIAGNOSIS — E039 Hypothyroidism, unspecified: Secondary | ICD-10-CM

## 2018-02-20 DIAGNOSIS — R42 Dizziness and giddiness: Secondary | ICD-10-CM

## 2018-02-20 DIAGNOSIS — S52124A Nondisplaced fracture of head of right radius, initial encounter for closed fracture: Secondary | ICD-10-CM

## 2018-02-20 DIAGNOSIS — I10 Essential (primary) hypertension: Secondary | ICD-10-CM

## 2018-02-20 DIAGNOSIS — E119 Type 2 diabetes mellitus without complications: Secondary | ICD-10-CM

## 2018-02-20 NOTE — Telephone Encounter (Signed)
Tried calling her but her answering machine was on.  Left a message for her

## 2018-02-20 NOTE — Progress Notes (Signed)
Kristina Shields is a 82 y.o. female (12/09/1935)    Nurses note:   review tests , (labs , ultrasounds )    Vitals:    02/20/18 0810   BP: 130/60   Pulse: 88   Temp: 36.7 C (98.1 F)   Weight: 67.4 kg (148 lb 8 oz)   Height: 1.549 m (5\' 1" )         CC: Follow-up of Kristina Shields recent fall and fracture of right elbow    HPI: Kristina Shields is a 82 year old Caucasian lady who was brought in today by Kristina Shields caregiver for follow-up of Kristina Shields recent fall and fracture of right elbow.  She has been following with Kristina Shields orthopedic physician.      As she did complain about occasional dizziness therefore bilateral carotid artery ultrasound was ordered along with a AAA screening.  CBC, CMP and TSH was also ordered for screening purposes.  Kristina Shields losartan was reduced from 100-50 mg daily.  Per caregiver's history she has been doing quite well since then on losartan 50 mg daily.  Kristina Shields denies any dizziness or lightheadedness since then.  She denies any headache.    Per Kristina Shields she does not have any discomfort in Kristina Shields right elbow anymore neither she has any significant aches and pains in Kristina Shields body.  Per Kristina Shields Shields she has 0 out of 10 pain today.    Kristina Shields past history is significant for mental retardation, diabetes type 2, hypertension, GERD, osteopenia, mild depression, hypothyroidism and vitamin D deficiency.  She voices no new concerns for today.    ROS  Review of systems:    HEENT: Denies any problem. Negative for hearing loss.   Endocrinologic: Denies any problem.  Positive history of diabetes and hypothyroidism   Respiratory: Denies any shortness of breath, cough or wheezing  Cardiovascular: Negative for chest pain or peripheral edema.  Gastroenterologic: Negative for nausea, vomiting, constipation or diarrhea.  Denies any abdominal pain.  Urinary: Denies any problem.  Musculoskeletal:Denies any problem .  Recent history of fracture  of right radius.  Neurological: Negative for seizure or migraine headache.   Psychiatric/behavioral:  Negative for  any depression or anxiety   Dermatological: Denies any problem.  Denies any rash or pruritus.    No Known Allergies (drug, envir, food or latex)    Current Outpatient Prescriptions   Medication    losartan (COZAAR) 50 MG tablet    calcium carbonate-vitamin D 600-400 MG-UNIT per tablet    pantoprazole (PROTONIX) 40 MG EC tablet    levothyroxine (SYNTHROID, LEVOTHROID) 100 MCG tablet    neomycin-bacitracin-polymyxin (NEOSPORIN) ointment    SM ASPIRIN ADULT LOW STRENGTH 81 MG EC tablet    Multiple Vitamin (TAB-A-VITE) tablet    glimepiride (AMARYL) 1 MG tablet    DULoxetine (CYMBALTA) 30 MG DR capsule    zinc oxide 20 % ointment    guaiFENesin (ROBITUSSIN) 100 MG/5ML syrup    acetaminophen (TYLENOL) 325 MG tablet    cholecalciferol (VITAMIN D) 2000 units tablet     No current facility-administered medications for this visit.        No past medical history on file.  Social History     Social History    Marital status: Single     Spouse name: N/A    Number of children: N/A    Years of education: N/A     Occupational History    Not on file.     Social History Main Topics    Smoking status: Never Smoker  Smokeless tobacco: Never Used    Alcohol use Not on file    Drug use: Unknown    Sexual activity: Not on file     Social History Narrative    No narrative on file         Physical Exam  No acute distress.  Eyes: Clear conjunctiva.  No mucous discharge.  PERRLA  Ears: Normal pinnae.  Acuity  to conversational tones is good.    Both canals are clear.  Tympanic membranes appear gray and pearly.  Nose: Pink and moist mucosa.  No sinus tenderness.  Throat: Pink and moist mucosa, no exudate or postnasal drip.  Neck:  Trachea midline.  No thyromegaly.  No visible or palpable masses noted.  No cervical or supraclavicular lymphadenopathy  Chest: Clear to auscultate bilaterally, with good air movement and no wheezes or rales.  No accessory muscle usage.  Respirations unlabored.   Heart: S1 and S2 regular  rate and rhythm.  No murmur or gallop  Abdomen: Soft, nontender and normoactive bowel sounds.  No overt hepatosplenomegaly  Lower extremities: No clubbing, cyanosis or edema.  No ulcerations or any increased warmth noted.  Musculoskeletal: Exam of Kristina Shields right elbow showed no ecchymotic patches or edema.  She had nearly full range of motion of Kristina Shields right elbow without any increased pain.  She does not have any more sling.  She is walking without any difficulty.           1. Closed nondisplaced fracture of head of right radius, subsequent encounter:   She has been recuperating quite well.  Kristina Shields pain is now fully resolved and she has nearly full range of motion of Kristina Shields right arm.  Currently she does not require any pain medication on a regular basis but advised the caregiver to monitor Kristina Shields closely for any increased pain and in case she experiences any increased pain that she can use Tylenol 650 mg every 4 hourly as needed .  In case any complication arises then she should notify us immediately.    2. Essential hypertension  So far she has been doing quite well with losartan 50 mg daily.  We will continue with the same dose.  Repeat CMP in a few months   - Comprehensive metabolic panel; Future    3. Dizziness and giddiness  Kristina Shields dizziness and lightheadedness have resolved.  Lately she has not felt dizzy or lightheaded.  We will monitor Kristina Shields for any future issues.  Kristina Shields carotid artery ultrasound and AAA screening was negative.  Kristina Shields labs were also within normal limits     4. DM2 (diabetes mellitus, type 2)  Lab Results   Component Value Date    HA1C 5.8 (H) 07/27/2017     Kristina Shields sugar has been under excellent control.  For now we will continue Kristina Shields on glimepiride 1 mg tablet half tablet daily.  She was encouraged to stay on low carb diet.  We will check Kristina Shields labs in October 2019  - Hemoglobin A1c; Future  - Lipid add Rfx to Drt LDL if Trig >400; Future  - Comprehensive metabolic panel; Future    5. Acquired hypothyroidism  Continue  levothyroxine 100 g daily.  We will check Kristina Shields TSH and T4 in October 2019.    - TSH; Future  - T4, free; Future  6.  GERD    Continue pantoprazole 40 mg 1 tablet daily.  She was advised to stay on nonfat and non-spicy food.      Return  in about 3 months (around 05/22/2018).

## 2018-02-20 NOTE — Telephone Encounter (Signed)
Kristina Shields called and stated she would like to speak with Synetta Fail about today's appt with patient and care giver. She had some concerns and would like to have them addressed. Please call her at the office or on her cell if after 4pm

## 2018-02-21 NOTE — Telephone Encounter (Signed)
I tried calling a few times but I cannot get hold of her.  Please call her and ask her what are her concerns.  If she is concerned regarding her pain .  Then please tell her that Tyra does not have any pain on a regular basis.   Just because the x-ray showed degenerative disease does not mean that Seleana has to go on a regular pain medication.

## 2018-02-22 ENCOUNTER — Other Ambulatory Visit: Payer: Self-pay | Admitting: Primary Care

## 2018-02-24 NOTE — Telephone Encounter (Signed)
Her concerns were over the visit , she spoke with the receptionist and expressed she wasn't happy

## 2018-02-24 NOTE — Telephone Encounter (Signed)
laST IN 02/20/18

## 2018-02-26 NOTE — Telephone Encounter (Signed)
Left another message for Kristina Shields again I have tried multiple times to reach her but I'm just not getting through.

## 2018-03-02 ENCOUNTER — Other Ambulatory Visit: Payer: Self-pay | Admitting: Primary Care

## 2018-03-03 ENCOUNTER — Encounter: Payer: Self-pay | Admitting: Gastroenterology

## 2018-03-30 ENCOUNTER — Other Ambulatory Visit: Payer: Self-pay | Admitting: Primary Care

## 2018-03-31 NOTE — Telephone Encounter (Signed)
Last in 02/20/18 has a appt 04/16/18

## 2018-04-01 ENCOUNTER — Encounter: Payer: Self-pay | Admitting: Gastroenterology

## 2018-04-10 ENCOUNTER — Other Ambulatory Visit: Payer: Self-pay | Admitting: Endocrinology

## 2018-04-10 ENCOUNTER — Other Ambulatory Visit: Payer: Self-pay | Admitting: Gastroenterology

## 2018-04-10 LAB — UNMAPPED LAB RESULTS
Basophil # (HT): 0.1 10 3/uL — NL (ref 0.0–0.2)
Basophil % (HT): 0.6 % — NL (ref 0.0–1.8)
Eosinophil # (HT): 0.5 10 3/uL — NL (ref 0.0–0.5)
Eosinophil % (HT): 6.3 % — NL (ref 0.0–7.0)
Hematocrit (HT): 32.5 % — ABNORMAL LOW (ref 34.0–47.0)
Hemoglobin (HGB) (HT): 10.8 g/dL — ABNORMAL LOW (ref 11.5–16.0)
Lymphocyte # (HT): 2.1 10 3/uL — NL (ref 0.9–3.8)
Lymphocyte % (HT): 27.2 % — NL (ref 17.0–44.0)
MCHC (HT): 33.2 g/dL — NL (ref 32.0–36.0)
MCV (HT): 88.6 FL — NL (ref 81.0–99.0)
Mean Corpuscular Hemoglobin (MCH) (HT): 29.4 pg — NL (ref 26.0–34.0)
Monocyte # (HT): 0.6 10 3/uL — NL (ref 0.2–1.0)
Monocyte % (HT): 8.1 % — NL (ref 4.0–12.0)
Neutrophil # (HT): 4.5 10 3/uL — NL (ref 1.5–7.7)
Platelets (HT): 303 10 3/uL — NL (ref 140–400)
RBC (HT): 3.67 10 6/uL — ABNORMAL LOW (ref 3.80–5.20)
RDW (HT): 13.1 % — NL (ref 11.5–15.0)
Seg Neut % (HT): 57.8 % — NL (ref 40.0–75.0)
WBC (HT): 7.8 10 3/uL — NL (ref 4.0–10.8)

## 2018-04-10 LAB — CBC AND DIFFERENTIAL
Baso # K/uL: 0.1 10*3/uL (ref 0.0–0.2)
Basophil %: 0.6 % (ref 0.0–1.8)
Eos # K/uL: 0.5 10*3/uL (ref 0.0–0.5)
Eosinophil %: 6.3 % (ref 0.0–7.0)
Hematocrit: 32.5 % — ABNORMAL LOW (ref 34.0–47.0)
Hemoglobin: 10.8 g/dL — ABNORMAL LOW (ref 11.5–16.0)
Lymph # K/uL: 2.1 10*3/uL (ref 0.9–3.8)
Lymphocyte %: 27.2 % (ref 17.0–44.0)
MCH: 29.4 PG (ref 26.0–34.0)
MCHC: 33.2 g/dL (ref 32.0–36.0)
MCV: 88.6 FL (ref 81.0–99.0)
Mono # K/uL: 0.6 10*3/uL (ref 0.2–1.0)
Monocyte %: 8.1 % (ref 4.0–12.0)
Neut # K/uL: 4.5 10*3/uL (ref 1.5–7.7)
Platelets: 303 10*3/uL (ref 140–400)
RBC: 3.67 10*6/uL — ABNORMAL LOW (ref 3.80–5.20)
RDW: 13.1 % (ref 11.5–15.0)
Seg Neut %: 57.8 % (ref 40.0–75.0)
WBC: 7.8 10*3/uL (ref 4.0–10.8)

## 2018-04-10 LAB — MICROALBUMIN / CREATININE URINE RATIO
Creatinine, Ur: 86.3 mg/dL
Microalb/Creat Ratio: 9.3 mg/g (ref 0.0–20.0)
Microalbumin,UR: 8 mg/L (ref 0.0–23.0)

## 2018-04-10 LAB — COMPREHENSIVE METABOLIC PANEL
A/G RATIO: 1.5 (ref 1.1–1.8)
ALT: 12 U/L (ref 1–33)
AST: 19 U/L (ref 14–34)
Albumin: 3.9 g/dL (ref 3.5–5.2)
Alk Phos: 95 U/L (ref 53–141)
Bilirubin,Total: 0.6 mg/dL (ref 0.3–1.2)
CO2: 30 mmol/L (ref 20–31)
Calcium: 8.9 mg/dL (ref 8.6–10.2)
Chloride: 102 mmol/L (ref 99–109)
Creatinine: 0.79 mg/dL (ref 0.50–1.10)
Globulin: 2.6 GM/DL (ref 1.6–3.6)
Glucose: 87 MG/DL (ref 60–99)
Lab: 19 mg/dL (ref 9–23)
Potassium: 4.4 mmol/L (ref 3.5–5.1)
Sodium: 140 mmol/L (ref 136–145)
Total Protein: 6.5 g/dL (ref 5.7–8.2)

## 2018-04-10 LAB — LIPID PANEL
Chol/HDL Ratio: 3.1
Cholesterol: 180 mg/dL (ref 1–199)
HDL: 58 mg/dL (ref 40–60)
LDL Calculated: 107 mg/dL (ref <100)
Non HDL Cholesterol: 122 mg/dL (ref <130)
Triglycerides: 75 mg/dL (ref <150)

## 2018-04-10 LAB — TSH: TSH: 2.55 u[IU]/mL (ref 0.55–4.78)

## 2018-04-10 LAB — ESTIMATED GFR
GFR,Black: >60 mL/min/{1.73_m2} (ref >=60)
GFR,Caucasian: >60 mL/min/{1.73_m2} (ref >=60)

## 2018-04-10 LAB — T4, FREE: Free T4: 1.4 ng/dL (ref 0.9–1.8)

## 2018-04-10 LAB — HEMOGLOBIN A1C
Est Avg Glucose: 128 mg/dL — ABNORMAL HIGH (ref 68–126)
Hemoglobin A1C: 6.1 % — ABNORMAL HIGH (ref 4.2–5.6)

## 2018-04-11 ENCOUNTER — Encounter: Payer: Self-pay | Admitting: Gastroenterology

## 2018-04-16 ENCOUNTER — Ambulatory Visit: Payer: Medicare Other | Admitting: Primary Care

## 2018-04-17 ENCOUNTER — Encounter: Payer: Self-pay | Admitting: Gastroenterology

## 2018-04-18 ENCOUNTER — Encounter: Payer: Self-pay | Admitting: Gastroenterology

## 2018-05-29 ENCOUNTER — Ambulatory Visit: Payer: Medicare Other | Admitting: Primary Care

## 2018-06-01 ENCOUNTER — Other Ambulatory Visit: Payer: Self-pay | Admitting: Primary Care

## 2018-06-02 NOTE — Telephone Encounter (Signed)
Kristina Shields was last seen:02/20/18  Next scheduled office visit: 08/06/18

## 2018-06-17 ENCOUNTER — Encounter: Payer: Self-pay | Admitting: Gastroenterology

## 2018-07-19 ENCOUNTER — Other Ambulatory Visit: Payer: Medicare Other | Admitting: Primary Care

## 2018-07-21 NOTE — Telephone Encounter (Signed)
Honest was last seen:02/20/18  Next scheduled office visit: 08/06/18

## 2018-07-26 ENCOUNTER — Other Ambulatory Visit: Payer: Self-pay | Admitting: Endocrinology

## 2018-07-26 LAB — CBC AND DIFFERENTIAL
Baso # K/uL: 0.1 10*3/uL (ref 0.0–0.2)
Basophil %: 0.7 % (ref 0.0–1.8)
Eos # K/uL: 0.5 10*3/uL (ref 0.0–0.5)
Eosinophil %: 7.1 % — ABNORMAL HIGH (ref 0.0–7.0)
Hematocrit: 32.1 % — ABNORMAL LOW (ref 34.0–47.0)
Hemoglobin: 10.8 g/dL — ABNORMAL LOW (ref 11.5–16.0)
Lymph # K/uL: 2.1 10*3/uL (ref 0.9–3.8)
Lymphocyte %: 30.9 % (ref 17.0–44.0)
MCH: 29.8 PG (ref 26.0–34.0)
MCHC: 33.6 g/dL (ref 32.0–36.0)
MCV: 88.4 FL (ref 81.0–99.0)
Mono # K/uL: 0.5 10*3/uL (ref 0.2–1.0)
Monocyte %: 6.8 % (ref 4.0–12.0)
Neut # K/uL: 3.7 10*3/uL (ref 1.5–7.7)
Platelets: 312 10*3/uL (ref 140–400)
RBC: 3.63 10*6/uL — ABNORMAL LOW (ref 3.80–5.20)
RDW: 13.2 % (ref 11.5–15.0)
Seg Neut %: 54.5 % (ref 40.0–75.0)
WBC: 6.8 10*3/uL (ref 4.0–10.8)

## 2018-07-26 LAB — LIPID PANEL
Chol/HDL Ratio: 3
Cholesterol: 182 mg/dL (ref 1–199)
HDL: 60 mg/dL (ref 40–60)
LDL Calculated: 110 mg/dL (ref <100)
Non HDL Cholesterol: 122 mg/dL (ref <130)
Triglycerides: 58 mg/dL (ref <150)

## 2018-07-26 LAB — COMPREHENSIVE METABOLIC PANEL
A/G RATIO: 1.6 (ref 1.1–1.8)
ALT: 13 U/L (ref 1–33)
AST: 22 U/L (ref 14–34)
Albumin: 4.1 g/dL (ref 3.5–5.2)
Alk Phos: 99 U/L (ref 53–141)
Bilirubin,Total: 0.4 mg/dL (ref 0.3–1.2)
CO2: 30 mmol/L (ref 20–31)
Calcium: 9 mg/dL (ref 8.6–10.2)
Chloride: 105 mmol/L (ref 99–109)
Creatinine: 0.89 mg/dL (ref 0.50–1.10)
Globulin: 2.5 GM/DL (ref 1.6–3.6)
Glucose: 100 MG/DL — ABNORMAL HIGH (ref 60–99)
Lab: 28 mg/dL — ABNORMAL HIGH (ref 9–23)
Potassium: 4.6 mmol/L (ref 3.5–5.1)
Sodium: 141 mmol/L (ref 136–145)
Total Protein: 6.6 g/dL (ref 5.7–8.2)

## 2018-07-26 LAB — TSH: TSH: 3.33 u[IU]/mL (ref 0.55–4.78)

## 2018-07-26 LAB — UNMAPPED LAB RESULTS
Basophil # (HT): 0.1 10 3/uL — NL (ref 0.0–0.2)
Basophil % (HT): 0.7 % — NL (ref 0.0–1.8)
Eosinophil # (HT): 0.5 10 3/uL — NL (ref 0.0–0.5)
Eosinophil % (HT): 7.1 % — ABNORMAL HIGH (ref 0.0–7.0)
Hematocrit (HT): 32.1 % — ABNORMAL LOW (ref 34.0–47.0)
Hemoglobin (HGB) (HT): 10.8 g/dL — ABNORMAL LOW (ref 11.5–16.0)
Lymphocyte # (HT): 2.1 10 3/uL — NL (ref 0.9–3.8)
Lymphocyte % (HT): 30.9 % — NL (ref 17.0–44.0)
MCHC (HT): 33.6 g/dL — NL (ref 32.0–36.0)
MCV (HT): 88.4 FL — NL (ref 81.0–99.0)
Mean Corpuscular Hemoglobin (MCH) (HT): 29.8 pg — NL (ref 26.0–34.0)
Monocyte # (HT): 0.5 10 3/uL — NL (ref 0.2–1.0)
Monocyte % (HT): 6.8 % — NL (ref 4.0–12.0)
Neutrophil # (HT): 3.7 10 3/uL — NL (ref 1.5–7.7)
Platelets (HT): 312 10 3/uL — NL (ref 140–400)
RBC (HT): 3.63 10 6/uL — ABNORMAL LOW (ref 3.80–5.20)
RDW (HT): 13.2 % — NL (ref 11.5–15.0)
Seg Neut % (HT): 54.5 % — NL (ref 40.0–75.0)
WBC (HT): 6.8 10 3/uL — NL (ref 4.0–10.8)

## 2018-07-26 LAB — T4, FREE: Free T4: 1.1 ng/dL (ref 0.9–1.8)

## 2018-07-26 LAB — ESTIMATED GFR
GFR,Black: >60 mL/min/{1.73_m2} (ref >=60)
GFR,Caucasian: >60 mL/min/{1.73_m2} (ref >=60)

## 2018-07-29 ENCOUNTER — Encounter: Payer: Self-pay | Admitting: Gastroenterology

## 2018-08-06 ENCOUNTER — Encounter: Payer: Self-pay | Admitting: Primary Care

## 2018-08-06 ENCOUNTER — Ambulatory Visit: Payer: Medicare Other | Attending: Primary Care | Admitting: Primary Care

## 2018-08-06 ENCOUNTER — Encounter: Payer: Self-pay | Admitting: Gastroenterology

## 2018-08-06 VITALS — BP 130/80 | HR 80 | Temp 97.5°F | Ht 61.0 in | Wt 153.4 lb

## 2018-08-06 DIAGNOSIS — K219 Gastro-esophageal reflux disease without esophagitis: Secondary | ICD-10-CM

## 2018-08-06 DIAGNOSIS — Z23 Encounter for immunization: Secondary | ICD-10-CM

## 2018-08-06 DIAGNOSIS — Z0000 Encounter for general adult medical examination without abnormal findings: Secondary | ICD-10-CM

## 2018-08-06 DIAGNOSIS — E039 Hypothyroidism, unspecified: Secondary | ICD-10-CM

## 2018-08-06 DIAGNOSIS — E119 Type 2 diabetes mellitus without complications: Secondary | ICD-10-CM

## 2018-08-06 DIAGNOSIS — I10 Essential (primary) hypertension: Secondary | ICD-10-CM

## 2018-08-06 MED ORDER — ZOSTER VAC RECOMB ADJUVANTED 50 MCG IM SUSR *I*
50.0000 ug | Freq: Once | INTRAMUSCULAR | 1 refills | Status: AC
Start: 2018-08-06 — End: 2018-08-06

## 2018-08-06 NOTE — Patient Instructions (Signed)
Thank you for completing your Subsequent Annual Medicare Visit and breast exam   with [place] today.     The purpose of this visits was to:     Screen for disease   Assess risk of future medical problems   Help develop a healthy lifestyle   Update vaccines   Get to know your doctor in case of an illness    Patient Care Team:  Viona Gilmore, MD as PCP - General (Primary Care)  Frances Maywood, NP as PCP - [facility] (Primary Care)  Viona Gilmore, MD (Primary Care)  Frances Maywood, NP (Primary Care)     Medicare 5 Year Plan    The following items were identified as areas of concern during your screening today:  BMI greater than 25 - This is a risk for Heart Attack, Stroke, High Blood Pressure, Diabetes, High Cholesterol and other complications.       The Health Maintenance table below identifies screening tests and immunizations recommended by your health care team:  Health Maintenance   Topic Date Due    IMM-PREVNAR VACCINE 65 + YRS (1) 06/05/2003    IMM-PNEUMOVAX VACCINE 65 + YRS (1) 06/05/2003    IMM-ZOSTER (2 of 3) 08/24/2010    HEMOGLOBIN A1C  10/10/2018    FOOT EXAM  10/29/2018    OPHTHALMOLOGY EXAM  02/08/2019    URINE MICROALBUMIN  04/11/2019    LIPID DISORDER SCREENING  07/27/2019    DEPRESSION SCREEN YEARLY  08/07/2019    Fall Risk Screening  08/07/2019    HIV TESTING OFFERED  Completed    IMM-INFLUENZA  Completed     In addition, goals and orders placed to address these recommendations are listed in the "Today's Visit" section.    We wish you the best of health and look forward to seeing you again next year for your Annual Medicare Wellness Visit.     If you have any health care concerns before then, please do not hesitate to contact us.

## 2018-08-06 NOTE — Progress Notes (Signed)
Kristina Shields is a 83 y.o. female (1936/01/14)    Nurses note:   Subsequent Annual Medicare Visit and breast exam    Vitals:    08/06/18 0855   BP: 130/80   Pulse: 80   Temp: 36.4 C (97.5 F)   Weight: 69.6 kg (153 lb 6.4 oz)   Height: 1.549 m (_0 )         CC: Follow-up of hypertension, diabetes type 2, hypothyroidism and GERD    HPI: Kristina Shields is a 83 year old Caucasian lady who presented today for follow-up of her chronic conditions.  Her past history significant for hypertension, diabetes, hypothyroidism and GERD.   She voices no concerns for today.  She feels she is doing quite well.  Her past history is significant for intellectual disability and she lives in a community home.  She was brought in today by her nurse and per nurse's report Liya has been quite stable.    Hypothyroidism:  Currently she is on levothyroxine 100 g daily.  She denies any constipation or diarrhea.  Her report her bowel movements are quite regular.  Denies any increased fatigue.  Denies any dry skin or hair.  So far she has been tolerating this medication without any problem..      Lab results: 07/26/18  0902   TSH 3.33     Free T4   Date Value Ref Range Status   07/26/2018 1.1 0.9 - 1.8 ng/dL Final     Hypertension:  Currently she is on losartan 50 mg daily.  Denies any dizziness or lightheadedness.  Denies any other adverse effects with this medication.  Per her nurses report her blood pressure generally stays within normal limits.        Lab results: 07/26/18  0902   Sodium 141   Potassium 4.6   Chloride 105   CO2 30   UN 28*   Creatinine 0.89   GFR,Caucasian >60   GFR,Black >60   Glucose 100*   Calcium 9.0   Total Protein 6.6   Albumin 4.1   ALT 13   AST 22   Alk Phos 99   Bilirubin,Total 0.4     Diabetes type 2:  Currently she is on Amaryl 1 mg tablet half tablet daily.  She denies any episodes of hypoglycemia.  She does follow up with her endocrinologist regularly.  Lab Results   Component Value Date    HA1C 6.1 (H)  04/10/2018     GERD:  She takes Protonix 40 mg daily.  She denies any heartburn type of symptoms.  Denies any increased burping.  Denies any epigastric pain.  Per her report this medicine is working very well for her.  She feels quite comfortable lately.  She voices no other concerns for today.    ROS  Denies any nausea, vomiting, constipation or diarrhea.  Denies any chest pain or shortness of breath.    No Known Allergies (drug, envir, food or latex)    Current Outpatient Medications   Medication    pantoprazole (PROTONIX) 40 MG EC tablet    levothyroxine (SYNTHROID, LEVOTHROID) 100 MCG tablet    SM ASPIRIN ADULT LOW STRENGTH 81 MG EC tablet    Multiple Vitamin (TAB-A-VITE) tablet    losartan (COZAAR) 50 MG tablet    calcium carbonate-vitamin D 600-400 MG-UNIT per tablet    neomycin-bacitracin-polymyxin (NEOSPORIN) ointment    glimepiride (AMARYL) 1 MG tablet    DULoxetine (CYMBALTA) 30 MG DR capsule    guaiFENesin (ROBITUSSIN)  100 MG/5ML syrup    acetaminophen (TYLENOL) 325 MG tablet    cholecalciferol (VITAMIN D) 2000 units tablet    zoster vaccine recombinant (SHINGRIX) 50 MCG injection    zinc oxide 20 % ointment     No current facility-administered medications for this visit.        No past medical history on file.  Social History     Socioeconomic History    Marital status: Single     Spouse name: Not on file    Number of children: Not on file    Years of education: Not on file    Highest education level: Not on file   Occupational History    Not on file   Tobacco Use    Smoking status: Never Smoker    Smokeless tobacco: Never Used   Substance and Sexual Activity    Alcohol use: Never     Frequency: Never    Drug use: Never    Sexual activity: Not on file   Social History Narrative    Not on file         Physical Exam  No acute distress.  Eyes: Clear conjunctiva.  No mucous discharge.  PERRLA  Ears: Normal pinnae.  Acuity  to conversational tones is good.    Both canals are clear.   Tympanic membranes appear gray and pearly.  Nose: Pink and moist mucosa.  No sinus tenderness.  Throat: Pink and moist mucosa, no exudate or postnasal drip.  Neck:  Trachea midline.  No thyromegaly.  No visible or palpable masses noted.  No cervical or supraclavicular lymphadenopathy  Chest: Clear to auscultate bilaterally, with good air movement and no wheezes or rales.  No accessory muscle usage.  Respirations unlabored.  Breasts: Bilaterally symmetrical breasts.  No lumps or nodules noted.  No retraction or dimpling noted.  No discharge from her nipples.  No regional lymphadenopathy noted.   Heart: S1 and S2 regular rate and rhythm.  No murmur or gallop  Abdomen: Soft, nontender and normoactive bowel sounds.  No overt hepatosplenomegaly  Lower extremities: No clubbing, cyanosis or edema.  No ulcerations or any increased warmth noted.  Musculoskeletal: Full range of motion without any increase in pain .No joint effusion or erythema noted.  Strength 5 out of 5 in all 4 extremities.   Neuro: Alert and oriented x2. ( Intellectual disability, neurologically stable)  DTR 2+.  Cranial nerves II through XII are grossly intact.  Walks without any help  Skin: In general her skin appeared pink, warm and intact.  No suspicious lesions or ulcerations noted.  Psych: Normal mood and affect.  Normal thought process.  Maintaining good eye contact during the conversation   External genitalia:  No pelvic tenderness or CVA tenderness.  No mass palpable in her pelvic area.  Normal appearing external genitalia.  Vulva: Normal external genitalia.  Symmetric labia.  No skin lesions or pigmentation changes. Normal Bartholin's and Skene's glands.  Normal external urethral meatus.  No prolapse visible at the introitus.    Rectum:  Rectal exam showed hemorrhoids.  Small noninflamed skin tag noted at 6:00 position.         1. Essential hypertension  Advise her to continue with losartan 50 mg daily.    2. DM2 (diabetes mellitus, type  2)  Continue Amaryl 1 mg tablet half tablet daily    3. Acquired hypothyroidism  Continue levothyroxine 100 g daily    4. Gastroesophageal reflux disease without esophagitis  Continue  Protonix 40 mg daily    5. Encounter for immunization  A prescription for shingrix was sent in her pharmacy.  - zoster vaccine recombinant (SHINGRIX) 50 MCG injection; Inject 0.5 mLs (50 mcg total) into the muscle once for 1 dose Repeat dose once in 2-6 months  Dispense: 0.5 mL; Refill: 1          Return in about 1 year (around 08/07/2019).                    Today we reviewed and updated Kynzlie Campbells smoking status, activities of daily living, depression screen, fall risk, medications and allergies.   I have counseled the patient in the above areas.     Subjective:     Chief Complaint: Briannie Gutierrez is a 83 y.o. female here for a/an Subsequent Annual Medicare Visit and breast exam    In general, Amala Petion rates their overall health as:  good      Patient Care Team:  Maxine Glenn, MD as PCP - General (Primary Care)  Dina Rich, NP as PCP - [facility] (Primary Care)  Maxine Glenn, MD (Primary Care)  Dina Rich, NP (Primary Care)     Current Outpatient Medications on File Prior to Visit   Medication Sig Dispense Refill    pantoprazole (PROTONIX) 40 MG EC tablet TAKE ONE TABLET BY MOUTH EVERY DAY; SWALLOW WHOLE. DO NOT CRUSH, BREAK, OR CHEW 90 tablet 1    levothyroxine (SYNTHROID, LEVOTHROID) 100 MCG tablet TAKE ONE TABLET BY MOUTH EVERY DAY BEFORE BREAKFAST 90 tablet 1    SM ASPIRIN ADULT LOW STRENGTH 81 MG EC tablet TAKE ONE TABLET BY MOUTH EVERY DAY 100 tablet 1    Multiple Vitamin (TAB-A-VITE) tablet TAKE ONE TABLET BY MOUTH EVERY DAY 100 tablet 1    losartan (COZAAR) 50 MG tablet Take 50 mg by mouth daily      calcium carbonate-vitamin D 600-400 MG-UNIT per tablet TAKE ONE TABLET BY MOUTH EVERY DAY 90 tablet 1    neomycin-bacitracin-polymyxin (NEOSPORIN) ointment Apply topically  as needed for Wound Care      glimepiride (AMARYL) 1 MG tablet Take 0.5 mg by mouth      DULoxetine (CYMBALTA) 30 MG DR capsule Take 30 mg by mouth daily      guaiFENesin (ROBITUSSIN) 100 MG/5ML syrup Take 200 mg by mouth every 4 hours as needed for Cough or Congestion (x 2 days)      acetaminophen (TYLENOL) 325 MG tablet Take 650 mg by mouth every 4 hours as needed for Pain      cholecalciferol (VITAMIN D) 2000 units tablet Take 2,000 Units by mouth daily      zinc oxide 20 % ointment Apply topically as needed for Dry Skin 56 g 2     No current facility-administered medications on file prior to visit.      No Known Allergies (drug, envir, food or latex)  There are no active problems to display for this patient.    No past medical history on file.  No past surgical history on file.  No family history on file.  Social History     Socioeconomic History    Marital status: Single     Spouse name: Not on file    Number of children: Not on file    Years of education: Not on file    Highest education level: Not on file   Occupational History    Not on file  Tobacco Use    Smoking status: Never Smoker    Smokeless tobacco: Never Used   Substance and Sexual Activity    Alcohol use: Never     Frequency: Never    Drug use: Never    Sexual activity: Not on file   Social History Narrative    Not on file       Objective:     Vital Signs: BP 130/80 (BP Location: Left arm, Patient Position: Sitting, Cuff Size: large adult)    Pulse 80    Temp 36.4 C (97.5 F) (Temporal)    Ht 1.549 m (_0 )    Wt 69.6 kg (153 lb 6.4 oz)    BMI 28.98 kg/m    BMI: Body mass index is 28.98 kg/m.    Vision Screening Results (Welcome visit only):  No exam data present    Depression Screening Results:  Recent Review Flowsheet Data     PHQ Scores 08/06/2018 03/18/2017    PSQ2 Q1 - Interest/Pleasure N (No Data)     PSQ2 Q2 - Down, Depressed, Hopeless N (No Data)         Opioid Use/DAST- 10 Screening Results:   How many times in the past  year have you used an illegal drug or used a prescription medication for nonmedical reasons?: 0 (08/06/2018  9:01 AM)    Activities of Daily Living/Functional Screening Results:  Is the person deaf or does he/she have serious difficulty hearing?: N  Is this person blind or does he/she have serious difficulty seeing even when wearing glasses?: N  *Vision Status: Visual aid   Does this person have serious difficulty walking or climbing stairs?: N  Does this person have difficulty dressing or bathing?: Y  *Dressing: Needs Assistance  *Bathing: Needs Assistance  *Shopping: Needs Assistance  *House Keeping: Needs Assistance  *Managing Own Medications: Needs Assistance  *Handling Finances: Needs Assistance  If you need help, who helps you?: staff at home   Difficulty doing errands due to a physicial, mental or emotional condition: Yes  Difficulty remembering or making decisions due to a physicial, mental or emotional condition: Yes      Fall Risk Screening Results:  Have you fallen in the last year?: No , but fallen over 1 year ago   Did you sustain an injury which required medical attention?: Yes  What level of medical attention?: ED  Do you feel you are at risk for falling?: No      Assessment and Plan:      Cognitive Function:  Recall of recent and remote events appears:  Abnormal - Intellectual disability      Advanced Care Planning:  Has surrogate committee     The following health maintenance plan was reviewed with the patient:    Health Maintenance Topics with due status: Overdue       Topic Date Due    IMM-PREVNAR VACCINE 65 + YRS 06/05/2003 was done on 04/30/14    IMM-PNEUMOVAX VACCINE 65 + YRS 06/05/2003 was done on 09/18/2002  and possibly again on 04/23/12 ( it is documented as pneumonia shot -- not clear whether pneumovax or prevnar 13     IMM-ZOSTER 08/24/2010     Health Maintenance Topics with due status: Not Due       Topic Last Completion Date    OPHTHALMOLOGY EXAM 02/07/2017    FOOT EXAM 10/28/2017     HEMOGLOBIN A1C 04/10/2018    URINE MICROALBUMIN 04/10/2018    LIPID DISORDER SCREENING  07/26/2018    DEPRESSION SCREEN YEARLY 08/06/2018    Fall Risk Screening 08/06/2018     Health Maintenance Topics with due status: Completed       Topic Last Completion Date    HIV TESTING OFFERED 09/23/2017    IMM-INFLUENZA 04/17/2018     This health maintenance schedule, identified risks, a list of orders placed today and patient goals have been provided to Audley Hose in the after visit summary.     Plan for any concerns identified during screening or risk assessments:  Hypertension  Diabetes type 2  Over weight

## 2018-08-09 ENCOUNTER — Other Ambulatory Visit: Payer: Self-pay | Admitting: Primary Care

## 2018-08-12 NOTE — Telephone Encounter (Signed)
Kristina Shields was last seen: 08/06/2018   Next scheduled office visit: 02/04/19

## 2018-08-21 ENCOUNTER — Other Ambulatory Visit: Payer: Self-pay | Admitting: Primary Care

## 2018-08-21 MED ORDER — LOSARTAN POTASSIUM 50 MG PO TABS *I*
50.0000 mg | ORAL_TABLET | Freq: Every day | ORAL | 2 refills | Status: DC
Start: 2018-08-21 — End: 2020-08-02

## 2018-08-21 NOTE — Telephone Encounter (Signed)
Kristina Shields was last seen: 08/06/2018   Next scheduled office visit:02/04/19    [facility] patient

## 2018-08-22 ENCOUNTER — Encounter: Payer: Self-pay | Admitting: Gastroenterology

## 2018-09-04 ENCOUNTER — Encounter: Payer: Self-pay | Admitting: Gastroenterology

## 2018-09-15 ENCOUNTER — Encounter: Payer: Self-pay | Admitting: Gastroenterology

## 2018-09-19 ENCOUNTER — Other Ambulatory Visit: Payer: Self-pay | Admitting: Primary Care

## 2018-09-22 NOTE — Telephone Encounter (Signed)
Tobin was last seen: 08/06/2018   Next scheduled office visit: 02/04/19

## 2018-10-01 ENCOUNTER — Other Ambulatory Visit: Payer: Self-pay | Admitting: Primary Care

## 2018-10-01 NOTE — Telephone Encounter (Signed)
Duchess was last seen: 08/06/2018   Next scheduled office visit: 02/04/19

## 2018-10-17 ENCOUNTER — Other Ambulatory Visit: Payer: Self-pay | Admitting: Primary Care

## 2018-10-17 NOTE — Telephone Encounter (Signed)
Ariday was last seen: 08/06/2018   Next scheduled office visit: 02/04/19

## 2018-10-29 ENCOUNTER — Encounter: Payer: Self-pay | Admitting: Gastroenterology

## 2018-12-05 ENCOUNTER — Other Ambulatory Visit: Payer: Self-pay | Admitting: Primary Care

## 2018-12-05 NOTE — Telephone Encounter (Signed)
Kristina Shields was last seen: 08/06/18  Next scheduled office visit: 02/04/2019

## 2018-12-29 ENCOUNTER — Encounter: Payer: Self-pay | Admitting: Gastroenterology

## 2019-01-16 ENCOUNTER — Encounter: Payer: Self-pay | Admitting: Gastroenterology

## 2019-01-19 ENCOUNTER — Other Ambulatory Visit: Payer: Self-pay | Admitting: Primary Care

## 2019-01-19 NOTE — Telephone Encounter (Signed)
Kristina Shields was last seen: 08/06/18  Next Appt 05/07/19     [name] Patient

## 2019-02-04 ENCOUNTER — Ambulatory Visit: Payer: Medicare Other | Admitting: Primary Care

## 2019-02-05 ENCOUNTER — Other Ambulatory Visit: Payer: Self-pay | Admitting: Primary Care

## 2019-02-05 NOTE — Telephone Encounter (Signed)
Kristina Shields was last seen: 08/06/2018  Next scheduled office visit: Visit date not found

## 2019-02-13 ENCOUNTER — Other Ambulatory Visit: Payer: Self-pay | Admitting: Primary Care

## 2019-02-16 NOTE — Telephone Encounter (Signed)
Kristina Shields was last seen: 08/06/18  Next scheduled office visit: 05/07/2019

## 2019-02-16 NOTE — Telephone Encounter (Signed)
This patient needs her 6 monthly follow up

## 2019-02-17 NOTE — Telephone Encounter (Signed)
Has an appt scheduled for 11/20

## 2019-02-25 ENCOUNTER — Other Ambulatory Visit: Payer: Self-pay | Admitting: Gastroenterology

## 2019-03-10 ENCOUNTER — Encounter: Payer: Self-pay | Admitting: Gastroenterology

## 2019-03-12 ENCOUNTER — Encounter: Payer: Self-pay | Admitting: Gastroenterology

## 2019-03-31 ENCOUNTER — Encounter: Payer: Self-pay | Admitting: Gastroenterology

## 2019-04-24 ENCOUNTER — Other Ambulatory Visit: Payer: Self-pay | Admitting: Primary Care

## 2019-05-02 LAB — UNMAPPED LAB RESULTS
Hematocrit (HT): 33.3 % — ABNORMAL LOW (ref 34.0–47.0)
Hemoglobin (HGB) (HT): 11.2 g/dL — ABNORMAL LOW (ref 11.5–16.0)
MCHC (HT): 33.6 g/dL — NL (ref 32.0–36.0)
MCV (HT): 87.2 FL — NL (ref 81.0–99.0)
Mean Corpuscular Hemoglobin (MCH) (HT): 29.3 pg — NL (ref 26.0–34.0)
Platelets (HT): 320 10 3/uL — NL (ref 140–400)
RBC (HT): 3.82 10 6/uL — NL (ref 3.80–5.20)
RDW (HT): 13.3 % — NL (ref 11.5–15.0)
WBC (HT): 9.1 10 3/uL — NL (ref 4.0–10.8)

## 2019-05-07 ENCOUNTER — Ambulatory Visit: Payer: Medicare Other | Admitting: Primary Care

## 2019-07-29 ENCOUNTER — Other Ambulatory Visit: Payer: Self-pay | Admitting: Primary Care

## 2019-07-29 NOTE — Telephone Encounter (Signed)
0.2

## 2019-08-19 ENCOUNTER — Other Ambulatory Visit: Payer: Self-pay | Admitting: Primary Care

## 2019-08-26 ENCOUNTER — Other Ambulatory Visit: Payer: Self-pay | Admitting: Primary Care

## 2019-08-26 NOTE — Telephone Encounter (Signed)
Kristina Shields was last seen:

## 2019-08-30 ENCOUNTER — Other Ambulatory Visit: Payer: Self-pay | Admitting: Primary Care

## 2019-10-05 LAB — UNMAPPED LAB RESULTS
Hematocrit (HT): 34 % — ABNORMAL LOW (ref 35–47)
Hemoglobin (HGB) (HT): 11.1 g/dL — ABNORMAL LOW (ref 12.0–16.0)
MCHC (HT): 32.3 g/dL — NL (ref 31.0–37.5)
MCV (HT): 88 fL — NL (ref 80–100)
Mean Corpuscular Hemoglobin (MCH) (HT): 28.5 pg — NL (ref 26.0–34.0)
Platelets (HT): 243 10 3/uL — NL (ref 150–450)
RBC (HT): 3.89 10 6/uL — NL (ref 3.80–5.20)
RDW (HT): 15.1 % — NL (ref 0.0–15.2)
WBC (HT): 9.3 10 3/uL — NL (ref 4.0–11.0)

## 2019-11-12 LAB — UNMAPPED LAB RESULTS
Basophil # (HT): 0.1 10 3/uL — NL (ref 0.0–0.2)
Basophil % (HT): 1 % — NL (ref 0–3)
Eosinophil # (HT): 0.6 10 3/uL — NL (ref 0.0–0.6)
Eosinophil % (HT): 6 % — ABNORMAL HIGH (ref 0–5)
Hematocrit (HT): 35 % — NL (ref 35–47)
Hemoglobin (HGB) (HT): 11.3 g/dL — ABNORMAL LOW (ref 12.0–16.0)
Lymphocyte # (HT): 1.6 10 3/uL — NL (ref 1.0–4.8)
Lymphocyte % (HT): 16 % — NL (ref 15–45)
MCHC (HT): 32.4 g/dL — NL (ref 31.0–37.5)
MCV (HT): 88 fL — NL (ref 80–100)
Mean Corpuscular Hemoglobin (MCH) (HT): 28.5 pg — NL (ref 26.0–34.0)
Monocyte # (HT): 0.7 10 3/uL — NL (ref 0.1–1.0)
Monocyte % (HT): 7 % — NL (ref 0–15)
Neutrophil # (HT): 7.2 10 3/uL — NL (ref 1.8–8.0)
Platelets (HT): 325 10 3/uL — NL (ref 150–450)
RBC (HT): 3.97 10 6/uL — NL (ref 3.80–5.20)
RDW (HT): 15.1 % — NL (ref 0.0–15.2)
Seg Neut % (HT): 71 % — NL (ref 45–75)
WBC (HT): 10.2 10 3/uL — NL (ref 4.0–11.0)

## 2020-01-05 ENCOUNTER — Ambulatory Visit
Admission: AD | Admit: 2020-01-05 | Discharge: 2020-01-05 | Disposition: A | Discharge status: Home / Self Care | Payer: Medicare Other | Source: Ambulatory Visit | Attending: Emergency Medicine | Admitting: Emergency Medicine

## 2020-01-05 ENCOUNTER — Ambulatory Visit: Admit: 2020-01-05 | Discharge: 2020-01-05 | Disposition: A | Discharge status: Home / Self Care | Payer: Medicare Other

## 2020-01-05 DIAGNOSIS — R059 Cough, unspecified: Secondary | ICD-10-CM

## 2020-01-05 DIAGNOSIS — R05 Cough: Secondary | ICD-10-CM | POA: Insufficient documentation

## 2020-01-05 DIAGNOSIS — R0602 Shortness of breath: Secondary | ICD-10-CM

## 2020-01-05 DIAGNOSIS — R062 Wheezing: Secondary | ICD-10-CM

## 2020-01-05 HISTORY — DX: Hypothyroidism, unspecified: E03.9

## 2020-01-05 HISTORY — DX: Essential (primary) hypertension: I10

## 2020-01-05 MED ORDER — BENZONATATE 200 MG PO CAPS *I*
200.0000 mg | ORAL_CAPSULE | Freq: Three times a day (TID) | ORAL | 0 refills | Status: DC | PRN
Start: 2020-01-05 — End: 2020-08-02

## 2020-01-05 MED ORDER — IPRATROPIUM BROMIDE 0.02 % IN SOLN *I*
RESPIRATORY_TRACT | Status: AC
Start: 2020-01-05 — End: 2020-01-05
  Filled 2020-01-05: qty 2.5

## 2020-01-05 MED ORDER — ALBUTEROL SULFATE (2.5 MG/3ML) 0.083% IN NEBU *I*
2.5000 mg | INHALATION_SOLUTION | Freq: Once | RESPIRATORY_TRACT | Status: AC
Start: 2020-01-05 — End: 2020-01-05
  Administered 2020-01-05: 09:00:00 2.5 mg via RESPIRATORY_TRACT
  Filled 2020-01-05: qty 3

## 2020-01-05 MED ORDER — ALBUTEROL SULFATE (2.5 MG/3ML) 0.083% IN NEBU *I*
INHALATION_SOLUTION | RESPIRATORY_TRACT | Status: AC
Start: 2020-01-05 — End: 2020-01-05
  Filled 2020-01-05: qty 3

## 2020-01-05 MED ORDER — PREDNISONE 20 MG PO TABS *I*
40.0000 mg | ORAL_TABLET | Freq: Every day | ORAL | 0 refills | Status: AC
Start: 2020-01-05 — End: 2020-01-10

## 2020-01-05 MED ORDER — IPRATROPIUM BROMIDE 0.02 % IN SOLN *I*
500.0000 ug | Freq: Once | RESPIRATORY_TRACT | Status: AC
Start: 2020-01-05 — End: 2020-01-05
  Administered 2020-01-05: 500 ug via RESPIRATORY_TRACT
  Filled 2020-01-05: qty 2.5

## 2020-01-05 NOTE — ED Triage Notes (Signed)
Patient reports six days ago she developed a cough, sore throat, nasal/ chest congestion on and off. Patient was given Robitussin for her cough with no effect noted.        Triage Note   Jennette Dubin, LPN

## 2020-01-05 NOTE — Discharge Instructions (Addendum)
The chest xray is negative for infection.     -Rest, your body needs extra sleep, go to bed early.  -Drink lots of fluids especially water.  -Gargle with warm salt water especially in the morning when you first wake up, this will help your throat.  - You may take with OTC Mucinex with the tessalon perles for better results.   - Flonase nasal spray to help loosen nasal secretions.  -Cool mist vaporizer/ humidifier.

## 2020-01-05 NOTE — UC Provider Note (Signed)
History     Chief Complaint   Patient presents with    Cough    Sore Throat    Nasal Congestion    chest congestion     HPI  This is a 84 year old patient PMH hypertension, hypothryroid  who presents to the Urgent Care with a complaint of cough, chest congestion, post nasal drip for 6 days. Roommate now with the same symptoms both are covid vaccinated. Denies known asthma history.     Medical/Surgical/Family History     Past Medical History:   Diagnosis Date    Hypertension     Hypothyroid         There is no problem list on file for this patient.           History reviewed. No pertinent surgical history.  No family history on file.       Social History     Tobacco Use    Smoking status: Never Smoker    Smokeless tobacco: Never Used   Substance Use Topics    Alcohol use: Never    Drug use: Never     Living Situation     Questions Responses    Patient lives with Other(comment)    Comment: Family care home     Homeless No    Caregiver for other family member No    External Services None    Employment Disabled    Domestic Violence Risk No                Review of Systems   Review of Systems   Constitutional: Negative for chills and fever.   HENT: Positive for congestion and sore throat.    Respiratory: Positive for cough, chest tightness, shortness of breath and wheezing.        Physical Exam   Triage Vitals  Triage Start: Start, (01/05/20 0854)   First Recorded BP: 166/77, Resp: 16, Temp: 36.3 C (97.3 F), Temp src: TEMPORAL Oxygen Therapy SpO2: 95 %, Oximetry Source: Rt Hand, O2 Device: None (Room air), Heart Rate: 96, (01/05/20 0900) Heart Rate (via Pulse Ox): 96, (01/05/20 0900).  First Pain Reported  0-10 Scale: 2, Pain Location/Orientation: Throat, (01/05/20 0900)       Physical Exam  Vitals and nursing note reviewed.   Constitutional:       Appearance: She is well-developed.   Cardiovascular:      Rate and Rhythm: Normal rate.   Pulmonary:      Effort: Pulmonary effort is normal.      Breath  sounds: Wheezing present.   Musculoskeletal:      Cervical back: Neck supple.   Skin:     Capillary Refill: Capillary refill takes less than 2 seconds.   Neurological:      Mental Status: She is alert and oriented to person, place, and time.          Medical Decision Making        Medical Decision Making  Assessment:    Decrease wheezing after neb treatment.     Differential diagnosis:    Viral vs Bacterial illness vs Allergies     Plan and Results:    Follow up with PCP if symptoms do not improve.   Encounter orders    Orders Placed This Encounter      *Chest standard frontal and lateral views      albuterol (PROVENTIL) nebulization 2.5 mg      ipratropium (ATROVENT) 0.02 % nebulizer solution  500 mcg      benzonatate (TESSALON) 200 MG capsule      predniSONE (DELTASONE) 20 MG tablet        XRay results  *Chest standard frontal and lateral views    Result Date: 01/05/2020  01/05/2020 9:34 AM CHEST X-RAYS CLINICAL INFORMATION:  Wheezing, SOB. COMPARISON:  None. PROCEDURE: Frontal and lateral projections of the chest were obtained. FINDINGS: Lungs: The lungs are clear. Pleura: No pleural effusion is present. No pneumothorax. Heart: Normal. Bones: No acute osseous abnormality. Miscellaneous: None.     No acute findings. END OF IMPRESSION [facility] submits this DICOM format image data and final report to the [facility], an independent secure electronic health information exchange, on a reciprocally searchable basis (with patient authorization) for a minimum of 12 months after exam date.    Independent Review of: XRays this encounter and chart/prior records    Diagnosis and Disposition:    Discharged home    Discharge Medication List as of 01/05/2020 10:00 AM    START taking these medications    benzonatate (TESSALON) 200 MG capsule  Take 1 capsule (200 mg total) by mouth 3 times daily as needed for Cough You may take with OTC Mucinex with the tessalon perles for better results.  Disp-30 capsule, R-0,  Normal    predniSONE (DELTASONE) 20 MG tablet  Take 2 tablets (40 mg total) by mouth daily for 5 days  Disp-10 tablet, R-0, Normal                      Final Diagnosis  Final diagnoses:   [R05] Cough (Primary)         Rickard Patience, NP       I have reviewed nursing documentation, confirmed information with patient, and revised with nursing as necessary.       Huitt, Julieanne Manson, NP  01/05/20 1005

## 2020-03-11 ENCOUNTER — Ambulatory Visit
Admission: AD | Admit: 2020-03-11 | Discharge: 2020-03-11 | Disposition: A | Discharge status: Home / Self Care | Payer: Medicare Other | Source: Ambulatory Visit | Attending: Emergency Medicine | Admitting: Emergency Medicine

## 2020-03-11 DIAGNOSIS — Z20822 Contact with and (suspected) exposure to covid-19: Secondary | ICD-10-CM | POA: Insufficient documentation

## 2020-03-11 NOTE — Discharge Instructions (Signed)
Testing for COVID-19 will be completed. We will call you if it is positive. Otherwise, results can be seen on MyChart. You need to quarantine AT LEAST until results come back. Isolation is recommended until you are symptom free     You should remain isolated from others in your home, wear mask anytime you are within 6 feet of someone, do not share common household items (such as cups and silverware) and wash hands frequently.    Continue symptomatic care  - lozenges or chloraseptic spray for sore throat  - saline or flonase nasal spray for nasal congestion  - over the counter decongestant. Mucinex for chest congestion (drink lots of water with this medication)  - tylenol or ibuprofen as needed    If you develop shortness of breath, chest pain, fevers that are not reducing with tylenol / motrin, seek further immediate care in the ER

## 2020-03-11 NOTE — ED Triage Notes (Signed)
PT's home care provider states the state nurse wants a covid test to rule it out.       Triage Note   Conception Oms, LPN

## 2020-03-12 LAB — COVID-19 NAAT (PCR): COVID-19 NAAT (PCR): NEGATIVE

## 2020-03-12 LAB — COVID-19 PCR

## 2020-03-12 NOTE — UC Provider Note (Signed)
History     Chief Complaint   Patient presents with    Cough    Nasal Congestion    Shortness of Breath     with excertion     This female patient's home care aide brings her into the urgent care to be Covid tested.  She has had cough, nasal congestion and intermittent shortness of breath with exertion for about 6 months.  She is being evaluated by another provider for these issues however she has not been Covid tested and is here for that reason.          Medical/Surgical/Family History     Past Medical History:   Diagnosis Date    Hypertension     Hypothyroid         There is no problem list on file for this patient.           History reviewed. No pertinent surgical history.  History reviewed. No pertinent family history.       Social History     Tobacco Use    Smoking status: Never Smoker    Smokeless tobacco: Never Used   Substance Use Topics    Alcohol use: Never    Drug use: Never     Living Situation     Questions Responses    Patient lives with Other(comment)    Comment: Family care home     Homeless No    Caregiver for other family member No    External Services None    Employment Disabled    Domestic Violence Risk No                Review of Systems   Review of Systems    Physical Exam   Triage Vitals  Triage Start: Start, (03/11/20 1900)   First Recorded BP: 162/71, Resp: 18, Temp: 35.6 C (96.1 F), Temp src: TEMPORAL Oxygen Therapy SpO2: 98 %, Oximetry Source: Rt Hand, O2 Device: None (Room air), Heart Rate: 79, (03/11/20 1901)  .  First Pain Reported  0-10 Scale: 0, (03/11/20 1901)       Physical Exam     Medical Decision Making        Medical Decision Making  Assessment:    Covid test    Plan and Results:    This female patient is here for Covid test only.  She will be contacted in the event that she is positive.  She will continue her evaluation for her symptoms through the provider who has been coordinating that evaluation.          Final Diagnosis  Final diagnoses:   [Z20.822] COVID-19  ruled out by laboratory testing (Primary)         Sherron Ales, PA       I have reviewed nursing documentation, confirmed information with patient, and revised with nursing as necessary.       Sherron Ales, [place]  03/12/20 1730

## 2020-07-26 LAB — UNMAPPED LAB RESULTS
Basophil # (HT): 0.1 10 3/uL — NL (ref 0.0–0.2)
Basophil % (HT): 1 % — NL (ref 0–2)
Eosinophil # (HT): 0.7 10 3/uL — ABNORMAL HIGH (ref 0.0–0.5)
Eosinophil % (HT): 8 % — ABNORMAL HIGH (ref 0–7)
Hematocrit (HT): 35 % — NL (ref 34–47)
Hemoglobin (HGB) (HT): 11.3 g/dL — ABNORMAL LOW (ref 11.5–16.0)
Lymphocyte # (HT): 1.9 10 3/uL — NL (ref 0.9–3.8)
Lymphocyte % (HT): 22 % — NL (ref 17–44)
MCHC (HT): 32.2 g/dL — NL (ref 32.0–36.0)
MCV (HT): 86 fL — NL (ref 81.0–99.0)
Mean Corpuscular Hemoglobin (MCH) (HT): 27.7 pg — NL (ref 26.0–34.0)
Monocyte # (HT): 0.6 10 3/uL — NL (ref 0.2–1.0)
Monocyte % (HT): 7 % — NL (ref 4–12)
Neutrophil # (HT): 5.2 10 3/uL — NL (ref 1.5–7.7)
Platelets (HT): 268 10 3/uL — NL (ref 140–400)
RBC (HT): 4.08 10 6/uL — NL (ref 3.80–5.20)
RDW (HT): 15 % — NL (ref 11.5–15.0)
Seg Neut % (HT): 62 % — NL (ref 40–75)
WBC (HT): 8.4 10 3/uL — NL (ref 4.0–10.8)

## 2020-08-02 ENCOUNTER — Encounter: Payer: Self-pay | Admitting: Family Medicine

## 2020-08-02 ENCOUNTER — Ambulatory Visit
Admission: AD | Admit: 2020-08-02 | Discharge: 2020-08-02 | Disposition: A | Discharge status: Home / Self Care | Payer: Medicare Other | Source: Ambulatory Visit | Attending: Family Medicine | Admitting: Family Medicine

## 2020-08-02 DIAGNOSIS — J40 Bronchitis, not specified as acute or chronic: Secondary | ICD-10-CM | POA: Insufficient documentation

## 2020-08-02 DIAGNOSIS — Z20822 Contact with and (suspected) exposure to covid-19: Secondary | ICD-10-CM | POA: Insufficient documentation

## 2020-08-02 MED ORDER — AZITHROMYCIN 250 MG PO TABS *I*
ORAL_TABLET | ORAL | 0 refills | Status: AC
Start: 2020-08-02 — End: 2020-08-07

## 2020-08-02 NOTE — ED Triage Notes (Signed)
Patient presenting to Urgent Care for testing only. COVID-19 PCR test ordered by Urgent Care Provider     Patient occupation: None of the above    Does this patient currently have symptoms concerning for COVID-19?: Yes      Nasopharyngeal swab obtained and sent for analysis.    Patient was offered a medical evaluation with an Urgent Care provider and is accepted.    Symptoms started 08/02/20

## 2020-08-02 NOTE — UC Provider Note (Addendum)
History     Chief Complaint   Patient presents with    Fever     T 101.5 @ 1115 am today temporally    Cough    chest congestion     85 year old female presents for evaluation of cough, chest congestion and fever of 1 days duration.  The patient was brought into urgent care with a female guardian who indicates that the patient has been experiencing a fever as high as 101.5 F today.  The patient was subsequently given an antipyretic and advised to present to urgent care for acute evaluation.  The patient is reported to have had a productive cough present for more than 1 month.  The patient is also reported to swallow the phlegm that she produces from said cough and so is not entirely sure as to what the color of the phlegm is.  The patient denies any shortness of breath or any other symptoms that are now already mentioned above as of this time.    The patient is reported to be followed by an ENT specialist as well as her gastroenterologist as a pertains to her chronic cough.  The patient is reported to be currently taking pantoprazole as well as that of Pepcid to better address her GERD.          Medical/Surgical/Family History     Past Medical History:   Diagnosis Date    Hypertension     Hypothyroid         Patient Active Problem List   Diagnosis Code    Bronchitis J40            History reviewed. No pertinent surgical history.  No family history on file.       Social History     Tobacco Use    Smoking status: Passive Smoke Exposure - Never Smoker    Smokeless tobacco: Never Used   Substance Use Topics    Alcohol use: Not Currently    Drug use: Not Currently     Living Situation     Questions Responses    Patient lives with Other(comment)    Comment: Family care home     Homeless No    Caregiver for other family member No    External Services None    Employment Disabled    Domestic Violence Risk No                Review of Systems   Review of Systems   Constitutional: Positive for fever. Negative for  chills, fatigue and unexpected weight change.   HENT: Positive for congestion. Negative for rhinorrhea, sinus pressure, sinus pain, trouble swallowing and voice change.    Eyes: Negative.    Respiratory: Positive for cough. Negative for apnea and shortness of breath.    Cardiovascular: Negative for chest pain.   Gastrointestinal: Negative for abdominal distention, abdominal pain, diarrhea, nausea, rectal pain and vomiting.   Endocrine: Negative.    Genitourinary: Negative.    Musculoskeletal: Negative for arthralgias and back pain.   Skin: Negative.        Physical Exam   Triage Vitals      First Recorded BP: 129/60, Resp: 16, Temp: 37.4 C (99.4 F), Temp src: TEMPORAL Oxygen Therapy SpO2: 94 %, Oximetry Source: Rt Hand, O2 Device: None (Room air), Heart Rate: 99, (08/02/20 1248) Heart Rate (via Pulse Ox): 99, (08/02/20 1248).  First Pain Reported  0-10 Scale: 0, (08/02/20 1248)       Physical  Exam  Vitals and nursing note reviewed.   Constitutional:       General: She is not in acute distress.     Appearance: Normal appearance. She is not ill-appearing, toxic-appearing or diaphoretic.   HENT:      Head: Normocephalic and atraumatic.      Right Ear: Tympanic membrane normal.      Left Ear: Tympanic membrane normal.      Nose: Nose normal.   Eyes:      Extraocular Movements: Extraocular movements intact.      Conjunctiva/sclera: Conjunctivae normal.      Pupils: Pupils are equal, round, and reactive to light.   Cardiovascular:      Rate and Rhythm: Normal rate and regular rhythm.   Pulmonary:      Effort: Pulmonary effort is normal. No respiratory distress.      Breath sounds: Normal breath sounds. No stridor. No wheezing, rhonchi or rales.   Abdominal:      General: Abdomen is flat. Bowel sounds are normal. There is no distension.      Palpations: Abdomen is soft.      Tenderness: There is abdominal tenderness in the right lower quadrant and epigastric area. There is no right CVA tenderness, left CVA tenderness,  guarding or rebound. Negative signs include Murphy's sign, Rovsing's sign and McBurney's sign.   Musculoskeletal:         General: Normal range of motion.      Cervical back: No rigidity or tenderness.   Lymphadenopathy:      Cervical: No cervical adenopathy.   Skin:     General: Skin is warm and dry.   Neurological:      Mental Status: She is alert.   Psychiatric:         Mood and Affect: Mood normal.          Medical Decision Making        Medical Decision Making  Assessment:    85 year old female presents for evaluation of congestion, cough and fever    Differential diagnosis:    Bronchitis  Viral syndrome  Covid    Plan and Results:    -Follow-up with PCP in 3-5 days as needed/scheduled  -Azithromycin  -Continue with Robitussin  -Hydration  -Return to urgent care or nearest acute medical center should there be no improvement or acute -worsening of symptoms despite current medical treatment.  -Monitor for sign/symptoms of infection  -Continue with all other forms of current medical treatment as it relates to any other previously diagnosed medical conditions.    Encounter orders    Orders Placed This Encounter      COVID-19 PCR      azithromycin (ZITHROMAX) 250 mg tablet        Diagnosis and Disposition:    Pt reported to have had a chest xray by PCP 1-2 months ago                Final Diagnosis  Final diagnoses:   [J40] Bronchitis (Primary)         Darien Ramus, DO       I have reviewed nursing documentation, confirmed information with patient, and revised with nursing as necessary.       Jackie Plum [place], DO  08/02/20 1331       Jackie Plum [place], DO  08/02/20 1331

## 2020-08-03 LAB — COVID-19 NAAT (PCR): COVID-19 NAAT (PCR): NEGATIVE

## 2020-08-03 LAB — COVID-19 PCR

## 2020-12-15 ENCOUNTER — Ambulatory Visit: Admit: 2020-12-15 | Discharge: 2020-12-15 | Disposition: A | Discharge status: Home / Self Care | Payer: Medicare Other

## 2020-12-15 ENCOUNTER — Ambulatory Visit
Admission: AD | Admit: 2020-12-15 | Discharge: 2020-12-15 | Disposition: A | Discharge status: Home / Self Care | Payer: Medicare Other | Source: Ambulatory Visit

## 2020-12-15 DIAGNOSIS — R059 Cough, unspecified: Secondary | ICD-10-CM | POA: Insufficient documentation

## 2020-12-15 DIAGNOSIS — B349 Viral infection, unspecified: Secondary | ICD-10-CM | POA: Insufficient documentation

## 2020-12-15 DIAGNOSIS — J40 Bronchitis, not specified as acute or chronic: Secondary | ICD-10-CM | POA: Insufficient documentation

## 2020-12-15 MED ORDER — METHYLPREDNISOLONE 4 MG PO TBPK *A*
ORAL_TABLET | ORAL | 0 refills | Status: AC
Start: 2020-12-15 — End: ?

## 2020-12-15 MED ORDER — ALBUTEROL SULFATE (2.5 MG/3ML) 0.083% IN NEBU *I*
2.5000 mg | INHALATION_SOLUTION | Freq: Once | RESPIRATORY_TRACT | Status: AC
Start: 2020-12-15 — End: 2020-12-15
  Administered 2020-12-15: 2.5 mg via RESPIRATORY_TRACT

## 2020-12-15 MED ORDER — AZITHROMYCIN 250 MG PO TABS *I*
ORAL_TABLET | ORAL | 0 refills | Status: AC
Start: 2020-12-15 — End: 2020-12-20

## 2020-12-15 MED ORDER — ALBUTEROL SULFATE (2.5 MG/3ML) 0.083% IN NEBU *I*
INHALATION_SOLUTION | RESPIRATORY_TRACT | Status: AC
Start: 2020-12-15 — End: 2020-12-15
  Filled 2020-12-15: qty 3

## 2020-12-15 MED ORDER — IPRATROPIUM BROMIDE 0.02 % IN SOLN *I*
500.0000 ug | Freq: Once | RESPIRATORY_TRACT | Status: AC
Start: 2020-12-15 — End: 2020-12-15
  Administered 2020-12-15: 500 ug via RESPIRATORY_TRACT

## 2020-12-15 MED ORDER — IPRATROPIUM BROMIDE 0.02 % IN SOLN *I*
RESPIRATORY_TRACT | Status: AC
Start: 2020-12-15 — End: 2020-12-15
  Filled 2020-12-15: qty 2.5

## 2020-12-15 MED ORDER — GUAIFENESIN 100 MG/5ML PO SYRUP *WRAPPED*
200.0000 mg | ORAL_SOLUTION | Freq: Three times a day (TID) | ORAL | 0 refills | Status: AC
Start: 2020-12-15 — End: 2020-12-17

## 2020-12-15 NOTE — UC Provider Note (Signed)
History     Chief Complaint   Patient presents with   . Cough     85 year old friendly female who presents to [facility] with her family caregiver, Alvis Lemmings, who reports patient has had a worsening cough for the last 3-4 days. She states patient went to [facility] this morning and told the nurse she spit up yellow/green stuff. Dawn believes patient is SOB with exertion. She denies recent fever/chills, vomiting/diarrhea. Patient and Dawn deny chest pain. Dawn states patient has a chronic cough due to reflux. She is followed by GI for this.               Medical/Surgical/Family History     Past Medical History:   Diagnosis Date   . Hypertension    . Hypothyroid         Patient Active Problem List   Diagnosis Code   . Bronchitis J40            History reviewed. No pertinent surgical history.  History reviewed. No pertinent family history.       Social History     Tobacco Use   . Smoking status: Passive Smoke Exposure - Never Smoker   . Smokeless tobacco: Never Used   Substance Use Topics   . Alcohol use: Not Currently   . Drug use: Not Currently     Living Situation     Questions Responses    Patient lives with Other(comment)    Comment: Family care giver      Homeless No    Caregiver for other family member No    External Services None    Employment Disabled    Domestic Violence Risk No                Review of Systems   Review of Systems   Constitutional: Negative for chills.   HENT: Negative for congestion.    Respiratory: Positive for cough and shortness of breath.    Cardiovascular: Negative for chest pain.   Gastrointestinal: Negative for diarrhea, nausea and vomiting.       Physical Exam   Triage Vitals     First Recorded BP: 144/82, Resp: 22, Temp: 36.8 C (98.2 F), Temp src: Tympanic Oxygen Therapy SpO2: 97 %, Oximetry Source: Lt Hand, O2 Device: None (Room air), Heart Rate: 93, (12/15/20 1147)  .  First Pain Reported  0-10 Scale: 0, (12/15/20 1147)       Physical Exam  Vitals and nursing note reviewed.   Constitutional:        General: She is not in acute distress.     Appearance: Normal appearance. She is not ill-appearing, toxic-appearing or diaphoretic.   HENT:      Head: Normocephalic and atraumatic.      Right Ear: Tympanic membrane normal.      Left Ear: Tympanic membrane normal.      Nose: Nose normal.      Mouth/Throat:      Mouth: Mucous membranes are moist.      Pharynx: Oropharynx is clear. No posterior oropharyngeal erythema.   Cardiovascular:      Rate and Rhythm: Normal rate and regular rhythm.      Heart sounds: Normal heart sounds. No murmur heard.    No gallop.   Pulmonary:      Effort: Pulmonary effort is normal. No respiratory distress.      Breath sounds: No stridor. Wheezing and rhonchi present. No rales.   Skin:  General: Skin is warm and dry.      Capillary Refill: Capillary refill takes less than 2 seconds.   Neurological:      General: No focal deficit present.      Mental Status: She is alert. Mental status is at baseline.      Comments: Dementia at baseline per Dawn   Psychiatric:         Mood and Affect: Mood normal.         Behavior: Behavior normal.          Medical Decision Making        Medical Decision Making  Assessment:    85 year old friendly female who presents to [facility] with her family caregiver, Dawn, who reports patient has had a worsening cough for the last 3-4 days.     Differential diagnosis:    Pneumonia  Bronchitis  Viral illness      Plan and Results:    -Declined viral swab- tested for Covid regularly.  -Duo-Neb administered in office with minimal response- Caregiver Dawn states it is difficult to administer. Dawn does not want patient to be prescribed albuterol for home - she states albuterol with a face spacer has not worked in the past. She does not want a  nebulizer treatments at home either because "patient is addicted to it and asks for it all the time"  -Medrol dose pak and Zpak ordered  -Symptomatic treatment measures-- cupping patient's back 3-4x/day for 15 minutes.  -Close  follow up with PCP   Encounter orders    Orders Placed This Encounter      *Chest standard frontal and lateral views      albuterol (PROVENTIL) nebulization 2.5 mg      ipratropium (ATROVENT) 0.02 % nebulizer solution 500 mcg      methylPREDNISolone (MEDROL PAK) 4 MG tablet pack      azithromycin (ZITHROMAX) 250 mg tablet      guaiFENesin (ROBITUSSIN) 100 mg/46mL syrup        Diagnosis and Disposition:   Bronchitis     Discharge Medications  Current Discharge Medication List    New Medications    methylPREDNISolone (MEDROL PAK) 4 MG tablet pack  Take according to package directions (6 day supply)  Quantity 21 tablet Refill 0  Start date: 12/15/2020  Comments: Emergency Encounter    azithromycin (ZITHROMAX) 250 mg tablet  for Bronchitis Take 2 tablets (500 mg) on day 1, followed by 1 tablet (250 mg) on days 2-5.  Quantity 6 tablet Refill 0  Start date: 12/15/2020, End date: 12/20/2020  Comments: Emergency Encounter    guaiFENesin (ROBITUSSIN) 200 mg  Dose: 200 mg  Take 200 mg by mouth 3 times daily   Quantity 118 mL Refill 0  Start date: 12/15/2020, End date: 12/17/2020            Follow-up  Follow-up Information    Schedule an appointment as soon as possible for a visit  with Yolonda Kida, MD.    Specialty: Family Medicine  Why: For Urgent Care Follow Up 6/6  Contact information:  [address]  [place] [address]  [phone]                  Patient Instructions  .      Viral swab declined today.    -Patient prescribed prednisone to help decrease inflammation in lungs.   Also prescribed Azithromycin for acute bronchitis.    -Chest xray completed today.  Results were normal.    Albuterol for home declined by Caregiver.  -To help with chest congestion--cup patient's back 3-4x/day for 15   minutes.    Viral Illness:    -Need to rest, your body needs extra sleep,   -Drink lots of fluids especially water, when youTre sick.  -Gargle with warm salt water especially in the morning when you first wake   up, this will help your  throat.  -Take Tylenol/motrin for aches and fever.  -Take an allergy medication: Claritin, or Zyrtec or Allegra. This will   help with nasal symptoms.  -For nasal congestion also okay to take a nasal decongestant such as   Sudafed x 3 days.  -Use saline nasal spray to help loosen nasal secretions.  -Mucinex for cough and congestion.  -Zinc Lozenges: can help reduce the duration of cold symptoms.  -Cool mist vaporizer/ humidifier  -Warm tea with honey (both honey and elderberry syrup have natural   anti-viral properties).    If worsening Shortness of breath, pain in chest or fever unrelieved by   tylenol/motrin please seek prompt eval in ED                Final Diagnosis  Final diagnoses:   None         Olena Heckle, NP              [place], [place], NP  12/15/20 1323

## 2020-12-15 NOTE — ED Triage Notes (Signed)
Pt is here with dawn a family care provider. Pt was arc and told one of the staff that she has a productive cough.  Pt care giver  doesn't know if the cough is related to bronchitis or acid reflux.

## 2020-12-15 NOTE — Discharge Instructions (Addendum)
Viral swab declined today.    -Patient prescribed prednisone to help decrease inflammation in lungs. Also prescribed Azithromycin for acute bronchitis.    -Chest xray completed today. Results were normal.     Albuterol for home declined by Caregiver.   -To help with chest congestion--cup patient's back 3-4x/day for 15 minutes.    Viral Illness:    -Need to rest, your body needs extra sleep,   -Drink lots of fluids especially water, when youre sick.  -Gargle with warm salt water especially in the morning when you first wake up, this will help your throat.  -Take Tylenol/motrin for aches and fever.  -Take an allergy medication: Claritin, or Zyrtec or Allegra. This will help with nasal symptoms.  -For nasal congestion also okay to take a nasal decongestant such as Sudafed x 3 days.  -Use saline nasal spray to help loosen nasal secretions.  -Mucinex for cough and congestion.  -Zinc Lozenges: can help reduce the duration of cold symptoms.  -Cool mist vaporizer/ humidifier  -Warm tea with honey (both honey and elderberry syrup have natural anti-viral properties).    If worsening Shortness of breath, pain in chest or fever unrelieved by tylenol/motrin please seek prompt eval in ED

## 2021-11-27 ENCOUNTER — Other Ambulatory Visit: Payer: Self-pay

## 2021-11-27 ENCOUNTER — Ambulatory Visit: Payer: Medicare Other | Attending: Orthopedic Surgery | Admitting: Orthopedic Surgery

## 2021-11-27 ENCOUNTER — Ambulatory Visit
Admission: RE | Admit: 2021-11-27 | Discharge: 2021-11-27 | Disposition: A | Discharge status: Home / Self Care | Payer: Medicare Other | Source: Ambulatory Visit

## 2021-11-27 VITALS — BP 151/69 | HR 91 | Temp 97.1°F | Resp 16

## 2021-11-27 DIAGNOSIS — M25772 Osteophyte, left ankle: Secondary | ICD-10-CM

## 2021-11-27 DIAGNOSIS — M19072 Primary osteoarthritis, left ankle and foot: Secondary | ICD-10-CM

## 2021-11-27 DIAGNOSIS — M25572 Pain in left ankle and joints of left foot: Secondary | ICD-10-CM

## 2021-11-27 NOTE — Patient Instructions (Signed)
Follow-up with primary care physician.    If your discomfort returns use ice 3-4 times a day 10 to 15 minutes at a time.

## 2021-11-27 NOTE — UC Provider Note (Signed)
History     Chief Complaint   Patient presents with   . Ankle Pain     Pt caregiver states this AM pt woke up limping. Left ankle seems to be bothering her.      Patient comes in today with her family caregiver.  Patient is minimally verbal.  Her caregiver tells me that she got up this morning and was limping .  Over the last 2 hours or so this has completely resolved.  Very clear to state that there was no injury.  Caregiver states she just wanted to be complete and make sure that nothing was wrong.      History provided by:  Patient  Ankle Pain      Medical/Surgical/Family History     Past Medical History:   Diagnosis Date   . Hypertension    . Hypothyroid         Patient Active Problem List   Diagnosis Code   . Bronchitis J40            History reviewed. No pertinent surgical history.  No family history on file.       Social History     Tobacco Use   . Smoking status: Passive Smoke Exposure - Never Smoker   . Smokeless tobacco: Never   Substance Use Topics   . Alcohol use: Not Currently   . Drug use: Not Currently     Living Situation     Questions Responses    Patient lives with Other(comment)    Comment: Family care giver      Homeless No    Caregiver for other family member No    External Services None    Employment Disabled    Domestic Violence Risk No                Review of Systems   Review of Systems   Musculoskeletal:        Left ankle pain       Physical Exam   Vitals     First Recorded BP: 151/69, Resp: 16, Temp: 36.2 C (97.1 F), Temp src: TEMPORAL Oxygen Therapy SpO2: 98 %, Heart Rate: 91, (11/27/21 0921)  .      Physical Exam  Vitals and nursing note reviewed.   Constitutional:       General: She is not in acute distress.     Appearance: Normal appearance. She is normal weight.   Musculoskeletal:      Left ankle: No swelling, deformity or ecchymosis. No tenderness. Normal range of motion. Anterior drawer test negative.        Legs:    Neurological:      General: No focal deficit present.       Mental Status: She is alert and oriented to person, place, and time.   Psychiatric:         Mood and Affect: Mood normal.         Behavior: Behavior normal.          Medical Decision Making   Medical Decision Making    Plan and Results:    Follow-up with PCP.  Patient's caregiver is clear to state that she is doing quite well at this point and not limping at all.  Patient does not complain of any pain at this time.  If the discomfort returns she can ice.        Independent Review of: XRays this encounter  X-rays consistent with DJD and calcification  of blood vessels.  Osteoporosis.  No acute findings are definitely seen.    Final Diagnosis    ICD-10-CM ICD-9-CM   1. Acute left ankle pain  M25.572 719.47         Eastyn Skalla, PA

## 2021-12-22 ENCOUNTER — Other Ambulatory Visit: Payer: Self-pay

## 2021-12-22 ENCOUNTER — Ambulatory Visit: Payer: Medicare Other | Attending: Urgent Care | Admitting: Urgent Care

## 2021-12-22 VITALS — BP 162/74 | HR 105 | Temp 98.0°F | Resp 28

## 2021-12-22 DIAGNOSIS — J189 Pneumonia, unspecified organism: Secondary | ICD-10-CM | POA: Insufficient documentation

## 2021-12-22 DIAGNOSIS — R0902 Hypoxemia: Secondary | ICD-10-CM | POA: Insufficient documentation

## 2021-12-22 NOTE — UC Provider Note (Signed)
History     Chief Complaint   Patient presents with   . Cough     Per caretaker pt has allergy related cough. Pt has multiple allergies per her. Pt has yellow and green phlegm per caretaker, and thinks pt has lung infection.     Presents with a worsening cough and increased S/O/B over the last two days, no apparent Fevers        No xray services available at this [facility]  Medical/Surgical/Family History     Past Medical History:   Diagnosis Date   . Environmental allergies    . Hypertension    . Hypothyroid         Patient Active Problem List   Diagnosis Code   . Bronchitis J40            History reviewed. No pertinent surgical history.  History reviewed. No pertinent family history.       Social History     Tobacco Use   . Smoking status: Never     Passive exposure: Yes   . Smokeless tobacco: Never   Substance Use Topics   . Alcohol use: Not Currently   . Drug use: Not Currently     Living Situation     Questions Responses    Patient lives with Other(comment)    Comment: Family care giver      Homeless No    Caregiver for other family member No    External Services None    Employment Disabled    Domestic Violence Risk No                Review of Systems   Review of Systems   All other systems reviewed and are negative.      Physical Exam   Vitals     First Recorded BP: 162/74, Resp: (!) 28 (informed provider TB), Temp: 36.7 C (98 F), Temp src: TEMPORAL Oxygen Therapy SpO2: 92 % (informed provider TB), Heart Rate: 105, (12/22/21 0822)  .      Physical Exam  Vitals and nursing note reviewed.   Pulmonary:      Effort: Respiratory distress present.      Breath sounds: Wheezing and rhonchi present.   Neurological:      Mental Status: She is alert.          Medical Decision Making   Medical Decision Making  Assessment:    Hypoxemia    Plan and Results:    Immediate transfer to the ED for advanced  diagnostic testing and care          Final Diagnosis    ICD-10-CM ICD-9-CM   1. Hypoxemia  R09.02 799.02   2. Pneumonia   J18.9 486         Robi Dewolfe, PA

## 2021-12-22 NOTE — Patient Instructions (Signed)
Go immediately to the ED for further evaluation

## 2023-02-12 IMAGING — CT CTA ABD AORTA LWR EXT RUNOFF
3 of 5 series · 6 of 46 positions shown, 9 images · non-contrast
Comparison: None

HISTORY: 85 year-old female with hypertension, atherosclerotic heart disease of native coronary artery without angina pectoris, PVD, polyuria.
TECHNIQUE: CT angiography studies of the abdomen, pelvis and bilateral legs were performed first without contrast followed by IV administration of nonionic contrast. Sagittal and coronal reformatted images were also obtained. Post-processing software generated rotating 3-D and MIP images performed under concurrent physician supervision.

[Series 4: pre contrast · axial · non-contrast · 0.76mm/px · z∈[+926,+1356]mm · 2 of 258 slices shown, 5 images]
[im 86/258  soft-tissue]
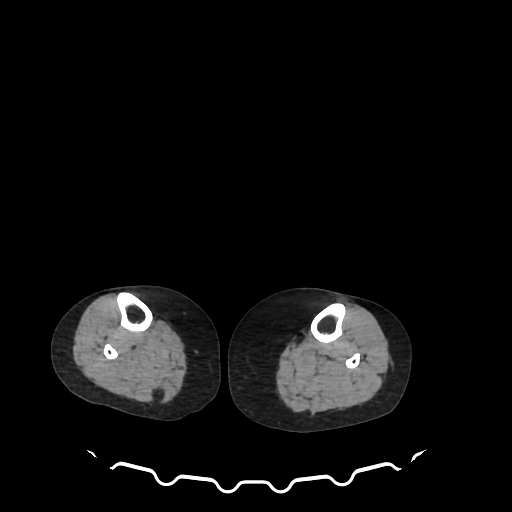
[im 86/258  lung]
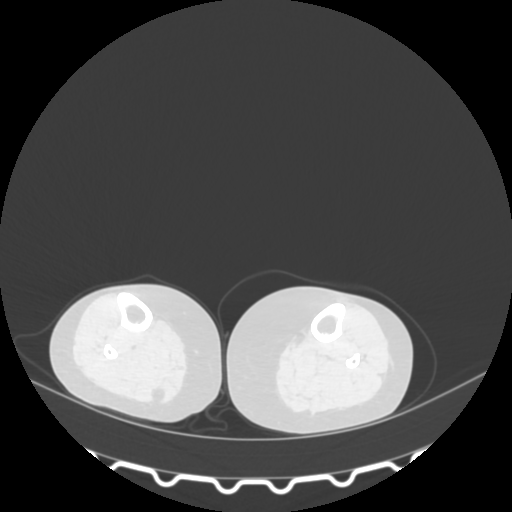
[im 86/258  bone]
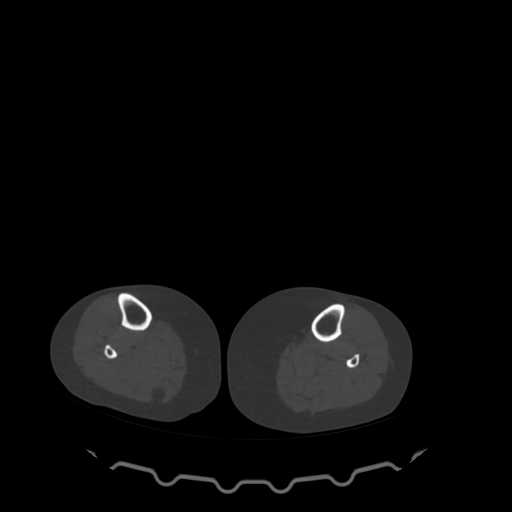
[im 172/258  soft-tissue]
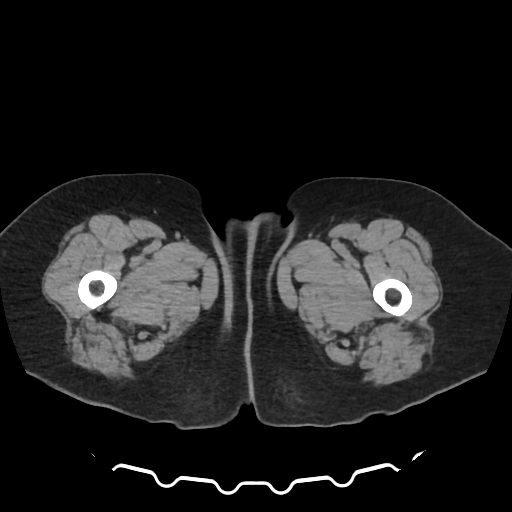
[im 172/258  lung]
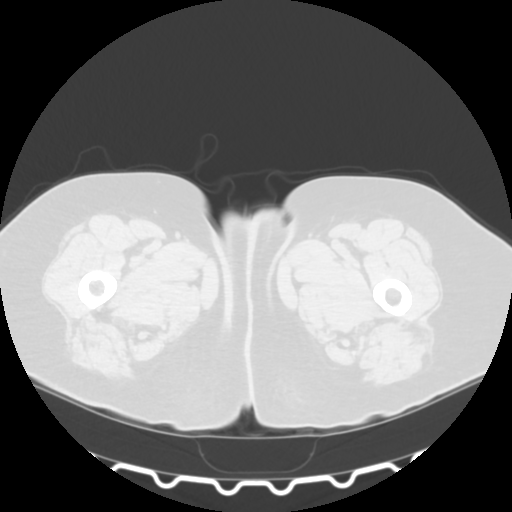

[Series 8: coronal · coronal · 0.76mm/px · 3 of 137 slices shown]
[im 46/137  soft-tissue]
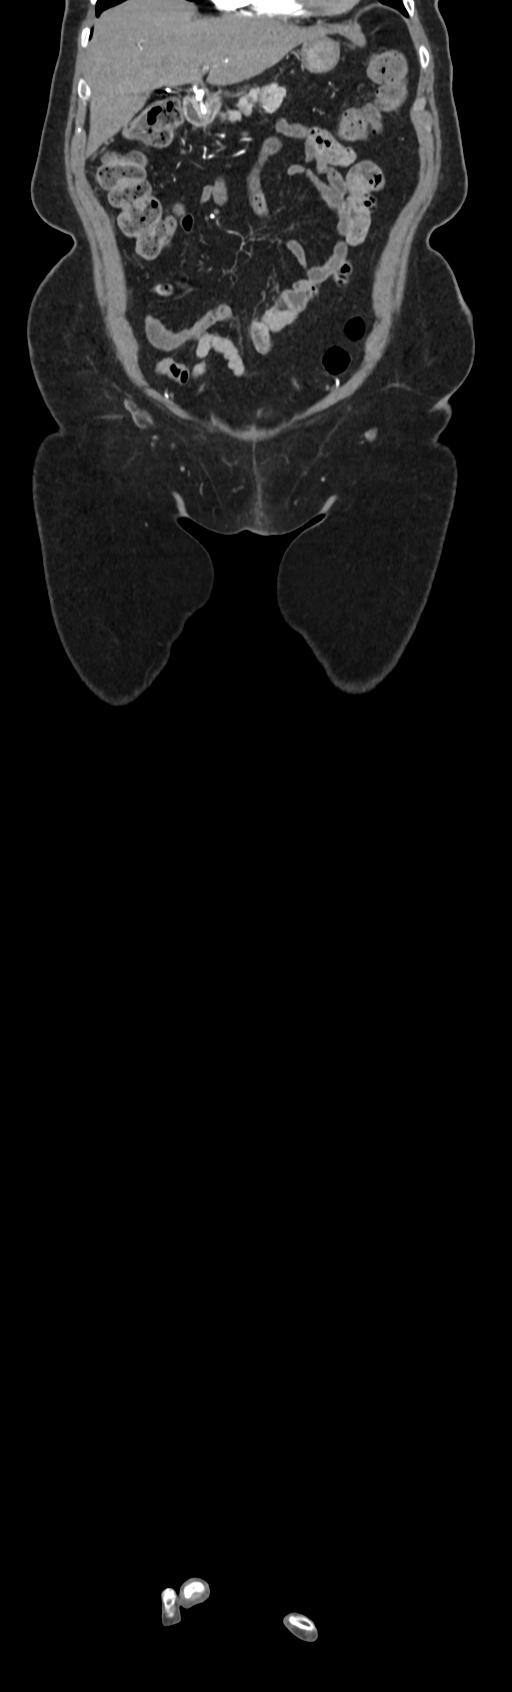
[im 61/137  soft-tissue]
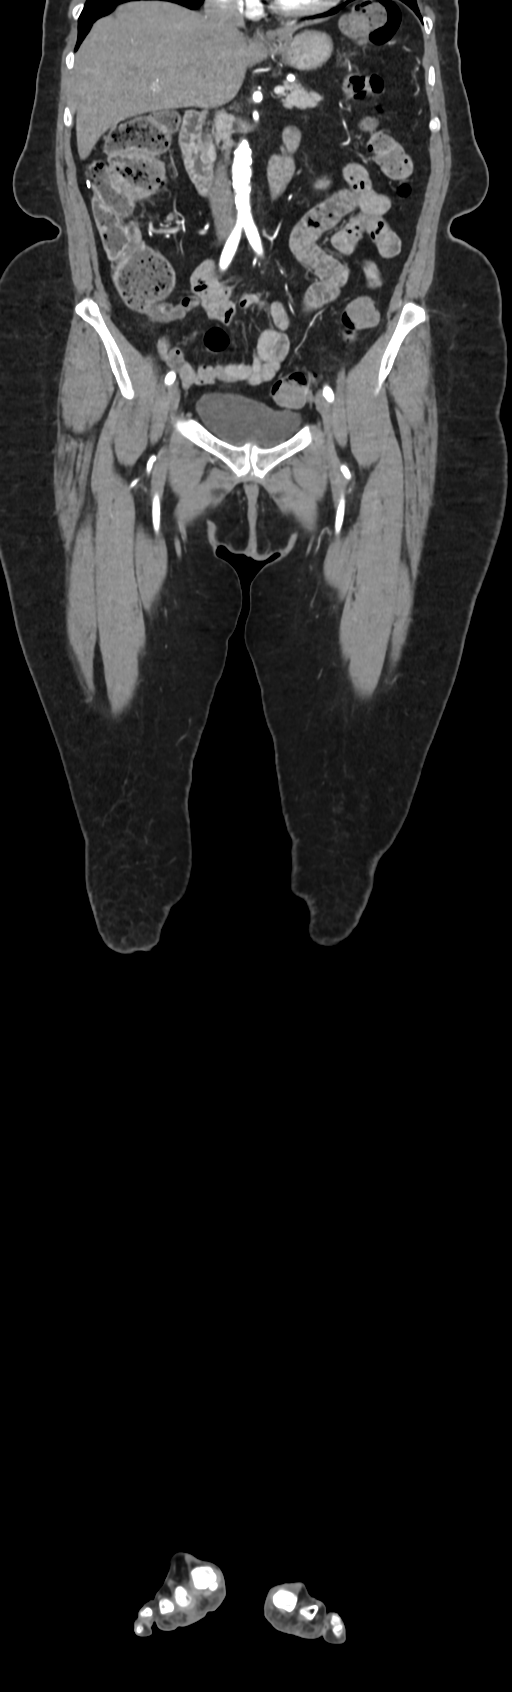
[im 76/137  soft-tissue]
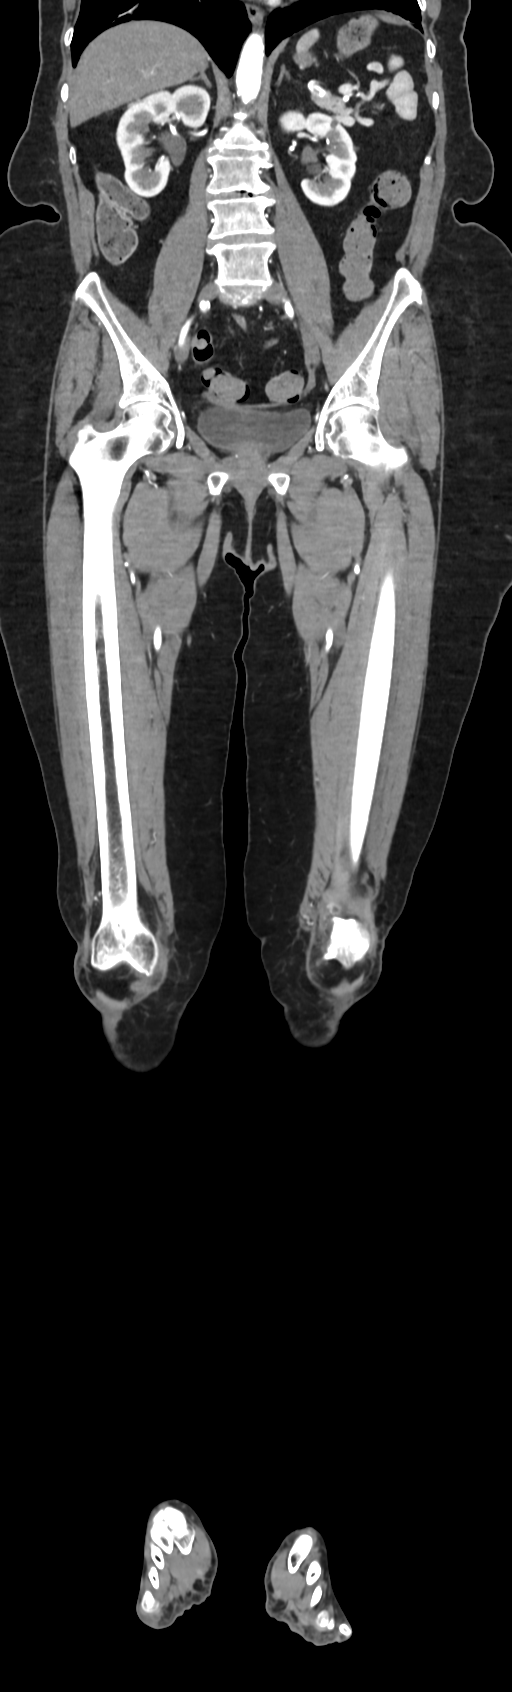

[Series 10: sagittal · sagittal · 0.76mm/px · 1 of 193 slices shown]
[im 65/193  soft-tissue]
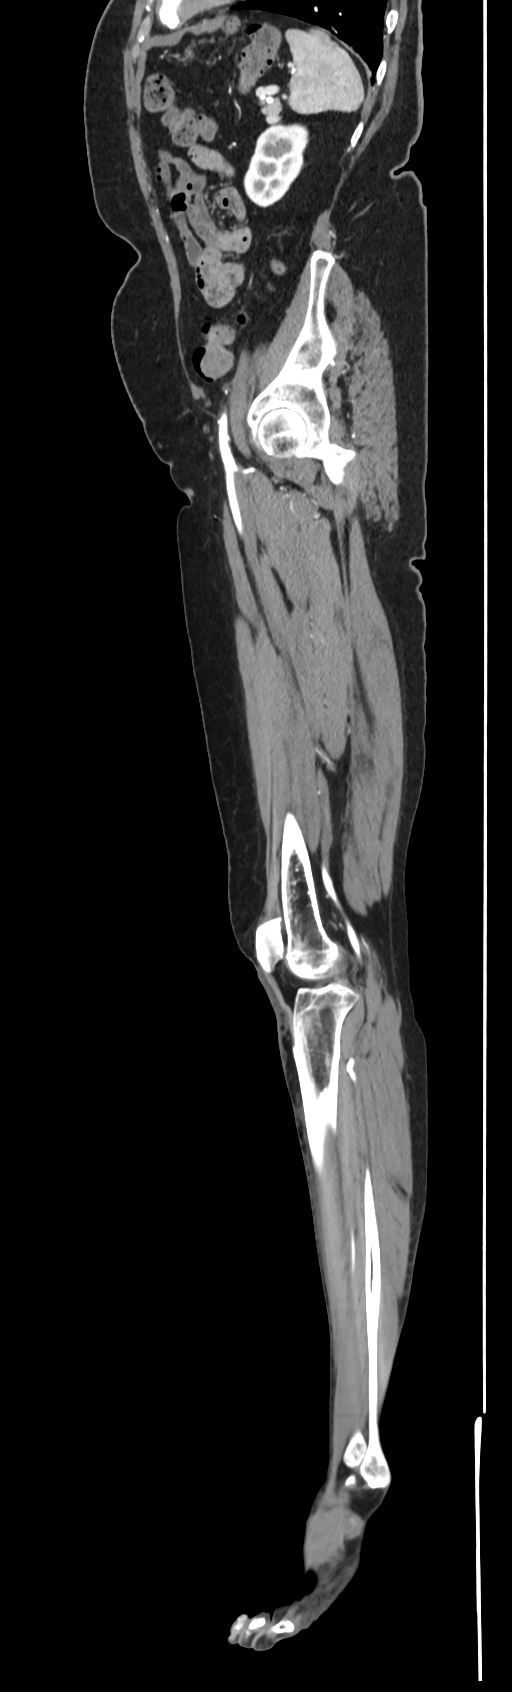

[6 of 46 positions shown; findings below may reference images not displayed]

Dose reduction technique used: Automated exposure control and adjustment of the mA and/or kV according to patient size. CT count in previous 12-months:  0     

Total radiation dose to patient is CTDIvol 19.39 mGy and DLP 1853.23 mGy-cm.

IV contrast: 125 cc Psovue-4DX intravenous contrast. 

Serum creatinine 0.9 mg/dL by i-STAT.

Oral contrast: None.
FINDINGS: Precontrast: Densely calcified aortoiliac calcified plaquing.

Visualized lung bases: Bibasilar fibrotic stranding.

Hepatobiliary system: The liver demonstrates homogeneous enhancement without focal lesion. No intra- or extrahepatic biliary ductal dilatation, although there are some specks of pneumobilia within the intrahepatic biliary tree, likely sphincterotomy. The splenic and portal veins are patent.

Gallbladder: Cholecystectomy.

Spleen: No splenomegaly or focal abnormalities.

Stomach: The stomach is unremarkable.

Pancreas: No focal masses. No pancreatic ductal dilatation. No significant peripancreatic inflammatory changes.

Adrenal glands: No focal masses.

Kidneys and collecting system: Bilateral 1 to 2 mm nonobstructing kidney stones.

GI tract and bowel: Normal in caliber without wall thickening or dilatation. There is no CT evidence appendicitis.

Retrocrural or para-aortic lymphadenopathy: None.

Pelvis: Hysterectomy.

Visualized osseous structures: L4 sclerotic area, likely bone island. Degenerative disc changes: No significant degenerative changes are seen.

Abdominal wall: The abdominal wall is intact.

Vasculature:

Abdominal aortogram: The abdominal aorta is patent..

Right common iliac artery: Patent.

Right external iliac artery: Patent.

Right internal iliac artery: Patent..

Right common femoral artery: Patent.

Right profunda femoral artery: Patent.

Right superficial femoral artery: Patent.

Left common iliac artery: Patent.

Left external iliac artery: Patent.

Left internal iliac artery: Patent.

Left common femoral artery: Patent.

Left profunda femoral artery: Patent.

Left superficial femoral artery: Mid SFA 20-30% focal stenosis. Remaining SFA looks widely patent.

Renal arteries: The right renal artery is patent. The left renal artery is patent.

Celiac artery: The celiac artery is unremarkable.

Superior mesenteric artery: The superior mesenteric artery is patent.

Inferior mesenteric artery: The inferior mesenteric artery is within normal limits.

Right leg arteriogram: There is no stenosis from the right common femoral down to the trifurcation level where there is three-vessel straight-line runoff into the foot with some mild 20-30% stenosing plaques along the anterior tibial course. The dorsalis pedis shows mild disease.

Left leg arteriogram: Other than the 30% mid left SFA stenosis, there are otherwise widely patent vessels down to the trifurcation level where there is also three-vessel runoff into the foot with only mildly diseased vessels. There does appear to be interconnection of the tarsal arches.

2-D and 3-D reconstructed images confirm the above findings.
IMPRESSION: No acute findings in the abdomen or pelvis. Bilateral three-vessel below the knee runoff, with only mild disease.

## 4689-08-16 DEATH — deceased
